# Patient Record
Sex: Female | Born: 1995 | Race: Black or African American | Hispanic: No | Marital: Single | Smoking: Never smoker
Health system: Southern US, Community
[De-identification: ages and names within clinical notes are randomized; demographics above are authoritative.]

## PROBLEM LIST (undated history)

## (undated) ENCOUNTER — Ambulatory Visit: Payer: Medicaid Other | Source: Home / Self Care

## (undated) DIAGNOSIS — N926 Irregular menstruation, unspecified: Secondary | ICD-10-CM

## (undated) DIAGNOSIS — E559 Vitamin D deficiency, unspecified: Secondary | ICD-10-CM

## (undated) DIAGNOSIS — J302 Other seasonal allergic rhinitis: Secondary | ICD-10-CM

## (undated) DIAGNOSIS — J45909 Unspecified asthma, uncomplicated: Secondary | ICD-10-CM

## (undated) HISTORY — PX: ANAL FISSURE REPAIR: SHX2312

---
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
## 2016-01-12 ENCOUNTER — Encounter (HOSPITAL_BASED_OUTPATIENT_CLINIC_OR_DEPARTMENT_OTHER): Payer: Self-pay | Admitting: *Deleted

## 2016-01-12 ENCOUNTER — Emergency Department (HOSPITAL_BASED_OUTPATIENT_CLINIC_OR_DEPARTMENT_OTHER)
Admission: EM | Admit: 2016-01-12 | Discharge: 2016-01-12 | Disposition: A | Discharge status: Home / Self Care | Payer: Medicaid Other | Attending: Emergency Medicine | Admitting: Emergency Medicine

## 2016-01-12 DIAGNOSIS — T7840XA Allergy, unspecified, initial encounter: Secondary | ICD-10-CM | POA: Diagnosis not present

## 2016-01-12 DIAGNOSIS — Z7722 Contact with and (suspected) exposure to environmental tobacco smoke (acute) (chronic): Secondary | ICD-10-CM | POA: Insufficient documentation

## 2016-01-12 DIAGNOSIS — Z9109 Other allergy status, other than to drugs and biological substances: Secondary | ICD-10-CM

## 2016-01-12 DIAGNOSIS — R21 Rash and other nonspecific skin eruption: Secondary | ICD-10-CM | POA: Diagnosis present

## 2016-01-12 MED ORDER — LORATADINE 10 MG PO TABS: 10.0000 mg | ORAL_TABLET | Freq: Every day | ORAL | Status: DC

## 2016-01-12 NOTE — Discharge Instructions (Signed)

## 2016-01-12 NOTE — ED Notes (Signed)
Pt states she needs a note to return to work.

## 2016-01-12 NOTE — ED Provider Notes (Signed)
CSN: [id number]     Arrival date & time 01/12/16  1912 History   First MD Initiated Contact with Patient 01/12/16 2031     Chief Complaint  Patient presents with  . Rash    HPI   20 year old female presents today with complaints of rash. Patient reports that several days ago she developed a rash around her neck, redness, itching. She reports that she recently changed laundry detergent, and has changed back. She reports symptoms have resolved, and denies any complaints presently. Patient notes that she tried returning to work, and they required a work note for her to return. Asian has no signs or symptoms today. Patient also reports she has a history of allergies and is requesting refill of her allergy medication.  Past Medical History  Diagnosis Date  . Seasonal allergies    History reviewed. No pertinent past surgical history. No family history on file. Social History  Substance Use Topics  . Smoking status: Passive Smoke Exposure - Never Smoker  . Smokeless tobacco: None  . Alcohol Use: No   OB History    No data available     Review of Systems  All other systems reviewed and are negative.   Allergies  Review of patient's allergies indicates no known allergies.  Home Medications   Prior to Admission medications   Medication Sig Start Date End Date Taking? Authorizing Provider  cetirizine (ZYRTEC) 10 MG tablet Take 1 tablet (10 mg total) by mouth daily. 11/22/15   Jerelyn Scott, MD  cetirizine-pseudoephedrine (ZYRTEC-D) 5-120 MG per tablet Take 1 tablet by mouth daily. 11/27/14   Teressa Lower, NP  loratadine (CLARITIN) 10 MG tablet Take 1 tablet (10 mg total) by mouth daily. 01/12/16   Eyvonne Mechanic, PA-C  predniSONE (DELTASONE) 20 MG tablet Take 2 tablets (40 mg total) by mouth daily. 12/04/15   Renne Crigler, PA-C   BP 126/88 mmHg  Pulse 84  Temp(Src) 98.3 F (36.8 C) (Oral)  Resp 20  Ht 5' 3.5" (1.613 m)  Wt 81.647 kg  BMI 31.38 kg/m2  SpO2 100%  LMP 12/27/2015    Physical Exam  Constitutional: She is oriented to person, place, and time. She appears well-developed and well-nourished.  HENT:  Head: Normocephalic and atraumatic.  Eyes: Conjunctivae are normal. Pupils are equal, round, and reactive to light. Right eye exhibits no discharge. Left eye exhibits no discharge. No scleral icterus.  Neck: Normal range of motion. No JVD present. No tracheal deviation present.  Pulmonary/Chest: Effort normal. No stridor.  Neurological: She is alert and oriented to person, place, and time. Coordination normal.  Skin: Skin is warm and dry. No rash noted. No erythema. No pallor.  Psychiatric: She has a normal mood and affect. Her behavior is normal. Judgment and thought content normal.  Nursing note and vitals reviewed.   ED Course  Procedures (including critical care time) Labs Review Labs Reviewed - No data to display  Imaging Review No results found. I have personally reviewed and evaluated these images and lab results as part of my medical decision-making.   EKG Interpretation None      MDM   Final diagnoses:  Environmental allergies    Labs:  Imaging:  Consults:  Therapeutics:  Discharge Meds:   Assessment/Plan: 24 oh female presents today for a work note so that she may return to work. She has no signs of rash today, no reason why she may not return to work. Patient given refill of her allergy medication.  Eyvonne MechanicJeffrey Hailley Byers, PA-C 01/12/16 2117  Doug SouSam Jacubowitz, MD 01/12/16 [phone]

## 2016-01-12 NOTE — ED Notes (Signed)
She had a rash on her neck and chest 2 days ago. No rash today. She needs a work note in order to go back to work.

## 2016-03-26 ENCOUNTER — Emergency Department (HOSPITAL_COMMUNITY): Payer: Medicaid Other

## 2016-03-26 ENCOUNTER — Encounter (HOSPITAL_COMMUNITY): Payer: Self-pay

## 2016-03-26 DIAGNOSIS — M25511 Pain in right shoulder: Secondary | ICD-10-CM | POA: Diagnosis not present

## 2016-03-26 DIAGNOSIS — R0789 Other chest pain: Secondary | ICD-10-CM | POA: Insufficient documentation

## 2016-03-26 DIAGNOSIS — Z7722 Contact with and (suspected) exposure to environmental tobacco smoke (acute) (chronic): Secondary | ICD-10-CM | POA: Diagnosis not present

## 2016-03-26 LAB — CBC
HCT: 35 % — ABNORMAL LOW (ref 36.0–46.0)
Hemoglobin: 12 g/dL (ref 12.0–15.0)
MCH: 29.3 pg (ref 26.0–34.0)
MCHC: 34.3 g/dL (ref 30.0–36.0)
MCV: 85.4 fL (ref 78.0–100.0)
PLATELETS: 312 10*3/uL (ref 150–400)
RBC: 4.1 MIL/uL (ref 3.87–5.11)
RDW: 13 % (ref 11.5–15.5)
WBC: 6.8 10*3/uL (ref 4.0–10.5)

## 2016-03-26 LAB — BASIC METABOLIC PANEL
Anion gap: 5 (ref 5–15)
BUN: 14 mg/dL (ref 6–20)
CALCIUM: 9 mg/dL (ref 8.9–10.3)
CHLORIDE: 106 mmol/L (ref 101–111)
CO2: 27 mmol/L (ref 22–32)
CREATININE: 0.79 mg/dL (ref 0.44–1.00)
GFR calc non Af Amer: >60 mL/min (ref >60)
GLUCOSE: 95 mg/dL (ref 65–99)
Potassium: 3.7 mmol/L (ref 3.5–5.1)
Sodium: 138 mmol/L (ref 135–145)

## 2016-03-26 LAB — I-STAT TROPONIN, ED: TROPONIN I, POC: 0 ng/mL (ref 0.00–0.08)

## 2016-03-26 NOTE — ED Triage Notes (Signed)
Patient c/o right sided chest pain that began today.  Pain is also felt in her back and shoulder.  Patient states that pain became worse today. Patient states that has not taken anything for the pain.  Denies SOB, denies N/V.  Rates pain 8/10.

## 2016-03-27 ENCOUNTER — Emergency Department (HOSPITAL_COMMUNITY)
Admission: EM | Admit: 2016-03-27 | Discharge: 2016-03-27 | Disposition: A | Discharge status: Home / Self Care | Payer: Medicaid Other | Attending: Emergency Medicine | Admitting: Emergency Medicine

## 2016-03-27 DIAGNOSIS — R0789 Other chest pain: Secondary | ICD-10-CM

## 2016-03-27 MED ORDER — IBUPROFEN 800 MG PO TABS
800.0000 mg | ORAL_TABLET | Freq: Three times a day (TID) | ORAL | 0 refills | Status: DC
Start: 2016-03-27 — End: 2016-07-18

## 2016-03-27 MED ORDER — KETOROLAC TROMETHAMINE 60 MG/2ML IM SOLN
60.0000 mg | Freq: Once | INTRAMUSCULAR | Status: DC
  Filled 2016-03-27: qty 2

## 2016-03-27 MED ORDER — IBUPROFEN 800 MG PO TABS
800.0000 mg | ORAL_TABLET | Freq: Once | ORAL | Status: AC
  Administered 2016-03-27: 800 mg via ORAL
  Filled 2016-03-27: qty 1

## 2016-03-27 NOTE — ED Notes (Signed)
Pt refused blood draw and toradol informed provider.

## 2016-03-27 NOTE — ED Provider Notes (Signed)
[facility]-EMERGENCY DEPT Provider Note   CSN: [id number] Arrival date & time: 03/26/16  2201  By signing my name below, I, Modena Jansky, attest that this documentation has been prepared under the direction and in the presence of non-physician practitioner, Cheri Fowler, PA-C. Electronically Signed: Modena Jansky, Scribe. 03/27/2016. 12:49 AM.  History   Chief Complaint Chief Complaint  Patient presents with  . Chest Pain   The history is provided by the patient. No language interpreter was used.    HPI Comments: Annette Ferguson is a 20 y.o. female who presents to the Emergency Department complaining of gradual onset,  constant moderate right sided chest pain that started this morning. She states that her gradual onset pain worsened and started radiating to her right shoulder and back throughout the day. She describes the chest pain as a pressure sensation, and her shoulder pain as a sharp sensation, with both being exacerbated by movement and improved by sitting still. She rates severity of her pain as a 9/10 when moving. She reports no injury or medication PTA. She states her grandmother died of MI at age 29. She denies any use of contraceptives, recent long trips or surgery, unilateral lower extremity edema, hx of DVT/PE, or personal cardiac history.  She also denies any SOB, cough, fever, numbness, weakness, abdominal pain, nausea, or vomiting.    LMNP: last month   PCP: Ferman Hamming, MD Past Medical History:  Diagnosis Date  . Seasonal allergies     There are no active problems to display for this patient.   History reviewed. No pertinent surgical history.  OB History    No data available       Home Medications    Prior to Admission medications   Medication Sig Start Date End Date Taking? Authorizing Provider  cetirizine (ZYRTEC) 10 MG tablet Take 1 tablet (10 mg total) by mouth daily. Patient not taking: Reported on 03/27/2016 11/22/15   Jerelyn Scott, MD    cetirizine-pseudoephedrine (ZYRTEC-D) 5-120 MG per tablet Take 1 tablet by mouth daily. Patient not taking: Reported on 03/27/2016 11/27/14   Teressa Lower, NP  ibuprofen (ADVIL,MOTRIN) 800 MG tablet Take 1 tablet (800 mg total) by mouth 3 (three) times daily. 03/27/16   Cheri Fowler, PA-C  loratadine (CLARITIN) 10 MG tablet Take 1 tablet (10 mg total) by mouth daily. Patient not taking: Reported on 03/27/2016 01/12/16   Eyvonne Mechanic, PA-C  predniSONE (DELTASONE) 20 MG tablet Take 2 tablets (40 mg total) by mouth daily. Patient not taking: Reported on 03/27/2016 12/04/15   Renne Crigler, PA-C    Family History No family history on file.  Social History Social History  Substance Use Topics  . Smoking status: Passive Smoke Exposure - Never Smoker  . Smokeless tobacco: Never Used  . Alcohol use No     Allergies   Review of patient's allergies indicates no known allergies.   Review of Systems Review of Systems  Constitutional: Negative for fever.  Respiratory: Negative for cough and shortness of breath.   Cardiovascular: Positive for chest pain (right sided).  Gastrointestinal: Negative for abdominal pain, nausea and vomiting.  Musculoskeletal: Positive for myalgias (right shoulder).  Neurological: Negative for weakness and numbness.     Physical Exam Updated Vital Signs BP 118/69 (BP Location: Left Arm)   Pulse 74   Temp 98.9 F (37.2 C) (Oral)   Resp 17   Ht 5\' 3"  (1.6 m)   Wt 190 lb (86.2 kg)   LMP 02/05/2016  SpO2 98%   BMI 33.66 kg/m   Physical Exam  Constitutional: She is oriented to person, place, and time. She appears well-developed and well-nourished.  Non-toxic appearance. She does not have a sickly appearance. She does not appear ill.  HENT:  Head: Normocephalic and atraumatic.  Mouth/Throat: Oropharynx is clear and moist.  Eyes: Conjunctivae are normal.  Neck: Normal range of motion. Neck supple.  Cardiovascular: Normal rate and regular rhythm.    Pulmonary/Chest: Effort normal and breath sounds normal. No accessory muscle usage or stridor. No respiratory distress. She has no wheezes. She has no rhonchi. She has no rales. She exhibits tenderness.    Abdominal: Soft. Bowel sounds are normal. She exhibits no distension. There is no tenderness.  Musculoskeletal: Normal range of motion.  Lymphadenopathy:    She has no cervical adenopathy.  Neurological: She is alert and oriented to person, place, and time.  Speech clear without dysarthria.  Skin: Skin is warm and dry.  Psychiatric: She has a normal mood and affect. Her behavior is normal.     ED Treatments / Results  DIAGNOSTIC STUDIES: Oxygen Saturation is 98% on RA, normal by my interpretation.    COORDINATION OF CARE: 12:55 AM- Pt advised of plan for treatment, which includes radiology and labs, and pt agrees.  Labs (all labs ordered are listed, but only abnormal results are displayed) Labs Reviewed  CBC - Abnormal; Notable for the following:       Result Value   HCT 35.0 (*)    All other components within normal limits  BASIC METABOLIC PANEL  I-STAT TROPOININ, ED    EKG  EKG Interpretation  Date/Time:  Saturday March 26 2016 22:17:27 EDT Ventricular Rate:  75 PR Interval:    QRS Duration: 92 QT Interval:  394 QTC Calculation: 441 R Axis:   76 Text Interpretation:  Sinus rhythm Borderline T abnormalities, anterior leads No significant change since last tracing Confirmed by KNOTT MD, DANIEL [phone]) on 03/27/2016 1:28:42 AM       Radiology Dg Chest 2 View  Result Date: 03/26/2016 CLINICAL DATA:  Acute onset of right-sided chest pain. Initial encounter. EXAM: CHEST  2 VIEW COMPARISON:  Chest radiograph performed 12/04/2015 FINDINGS: The lungs are well-aerated and clear. There is no evidence of focal opacification, pleural effusion or pneumothorax. The heart is normal in size; the mediastinal contour is within normal limits. No acute osseous abnormalities are  seen. IMPRESSION: No acute cardiopulmonary process seen. Electronically Signed   By: Roanna RaiderJeffery  Chang M.D.   On: 03/26/2016 22:58    Procedures Procedures (including critical care time)  Medications Ordered in ED Medications  ketorolac (TORADOL) injection 60 mg (60 mg Intramuscular Refused 03/27/16 0200)  ibuprofen (ADVIL,MOTRIN) tablet 800 mg (800 mg Oral Given 03/27/16 0206)     Initial Impression / Assessment and Plan / ED Course  I have reviewed the triage vital signs and the nursing notes.  Pertinent labs & imaging results that were available during my care of the patient were reviewed by me and considered in my medical decision making (see chart for details).  Clinical Course  Patient presents with atypical chest pain.  Worse with movement.  Pain is reproducible to palpation.  No SOB.  VSS, NAD. Heart RRR, lungs CTAB.  Improved with ibuprofen.  Low risk Well's criteria for PE, PERC negative. Doubt ACS, EKG unchanged from previous.  CXR normal. Doubt dissection.  Labs without acute abnormalities.  Suspect musculoskeletal etiology.  Home with ibuprofen.  Follow up  PCP.  Return precautions discussed.  Stable for discharge.    Final Clinical Impressions(s) / ED Diagnoses   Final diagnoses:  Atypical chest pain    New Prescriptions New Prescriptions   IBUPROFEN (ADVIL,MOTRIN) 800 MG TABLET    Take 1 tablet (800 mg total) by mouth 3 (three) times daily.   I personally performed the services described in this documentation, which was scribed in my presence. The recorded information has been reviewed and is accurate.     Cheri FowlerKayla Tell Rozelle, PA-C 03/27/16 [id number]    Lyndal Pulleyaniel Knott, MD 03/28/16 0230

## 2016-03-27 NOTE — ED Notes (Signed)
Patient called x1, no answer

## 2016-03-27 NOTE — ED Notes (Signed)
No respiratory or acute distress noted alert and oriented x 3 steady gait noted no reaction to medication noted.

## 2016-05-07 ENCOUNTER — Encounter (HOSPITAL_BASED_OUTPATIENT_CLINIC_OR_DEPARTMENT_OTHER): Payer: Self-pay | Admitting: Emergency Medicine

## 2016-05-07 ENCOUNTER — Emergency Department (HOSPITAL_BASED_OUTPATIENT_CLINIC_OR_DEPARTMENT_OTHER)
Admission: EM | Admit: 2016-05-07 | Discharge: 2016-05-07 | Disposition: A | Discharge status: Home / Self Care | Payer: Medicaid Other | Attending: Emergency Medicine | Admitting: Emergency Medicine

## 2016-05-07 DIAGNOSIS — X501XXA Overexertion from prolonged static or awkward postures, initial encounter: Secondary | ICD-10-CM | POA: Insufficient documentation

## 2016-05-07 DIAGNOSIS — Y929 Unspecified place or not applicable: Secondary | ICD-10-CM | POA: Insufficient documentation

## 2016-05-07 DIAGNOSIS — Y9301 Activity, walking, marching and hiking: Secondary | ICD-10-CM | POA: Diagnosis not present

## 2016-05-07 DIAGNOSIS — M79605 Pain in left leg: Secondary | ICD-10-CM | POA: Diagnosis present

## 2016-05-07 DIAGNOSIS — Z7722 Contact with and (suspected) exposure to environmental tobacco smoke (acute) (chronic): Secondary | ICD-10-CM | POA: Insufficient documentation

## 2016-05-07 DIAGNOSIS — Y999 Unspecified external cause status: Secondary | ICD-10-CM | POA: Diagnosis not present

## 2016-05-07 NOTE — ED Provider Notes (Signed)
[facility]   CSN: [id number] Arrival date & time: 05/07/16  1355  By signing my name below, I, Nelwyn SalisburyJoshua Fowler, attest that this documentation has been prepared under the direction and in the presence of Laurence Spatesachel Morgan Havilah Topor, MD . Electronically Signed: Nelwyn SalisburyJoshua Fowler, Scribe. 05/07/2016. 3:23 PM.  History   Chief Complaint Chief Complaint  Patient presents with  . Leg Pain   The history is provided by the patient. No language interpreter was used.    HPI Comments:  Annette Ferguson is a 20 y.o. female with no pertinent Pmhx who presents to the Emergency Department complaining of unchanged left leg pain beginning two days ago. She describes her symptoms as a sharp pain that begins in her left ankle and radiates up her left leg, exacerbated by standing. Sx began as b/l leg pain after working a double shift at work 2 days ago; she states her body is not used to doing this and she suspects she strained her legs from prolonged standing and walking. Her pain has been slightly alleviated by resting the area. Pt has not tried any OTC medications to relieve pain. When asked about any numbness, pt states that her leg was numb 2 nights ago but her symptoms are currently resolved. She denies any recent falls, injury, leg swelling, erythema, or tingling.  Pt has not traveled recently, has no history of blood clots, and is not on birth control.   Past Medical History:  Diagnosis Date  . Seasonal allergies     There are no active problems to display for this patient.   History reviewed. No pertinent surgical history.  OB History    No data available       Home Medications    Prior to Admission medications   Medication Sig Start Date End Date Taking? Authorizing Provider  cetirizine (ZYRTEC) 10 MG tablet Take 1 tablet (10 mg total) by mouth daily. Patient not taking: Reported on 03/27/2016 11/22/15   Jerelyn ScottMartha Linker, MD  cetirizine-pseudoephedrine (ZYRTEC-D) 5-120 MG  per tablet Take 1 tablet by mouth daily. Patient not taking: Reported on 03/27/2016 11/27/14   Teressa LowerVrinda Pickering, NP  ibuprofen (ADVIL,MOTRIN) 800 MG tablet Take 1 tablet (800 mg total) by mouth 3 (three) times daily. 03/27/16   Cheri FowlerKayla Rose, PA-C  loratadine (CLARITIN) 10 MG tablet Take 1 tablet (10 mg total) by mouth daily. Patient not taking: Reported on 03/27/2016 01/12/16   Eyvonne MechanicJeffrey Hedges, PA-C  predniSONE (DELTASONE) 20 MG tablet Take 2 tablets (40 mg total) by mouth daily. Patient not taking: Reported on 03/27/2016 12/04/15   Renne CriglerJoshua Geiple, PA-C    Family History No family history on file.  Social History Social History  Substance Use Topics  . Smoking status: Passive Smoke Exposure - Never Smoker  . Smokeless tobacco: Never Used  . Alcohol use No     Allergies   Review of patient's allergies indicates no known allergies.   Review of Systems Review of Systems 10 systems reviewed and all are negative for acute change except as noted in the HPI.   Physical Exam Updated Vital Signs BP 120/74 (BP Location: Right Arm)   Pulse 71   Temp 98.4 F (36.9 C) (Oral)   Resp 14   Ht 5\' 3"  (1.6 m)   Wt 180 lb (81.6 kg)   LMP 04/05/2016 (Approximate)   SpO2 100%   BMI 31.89 kg/m   Physical Exam  Constitutional: She is oriented to person, place, and time. She appears  well-developed and well-nourished. No distress.  HENT:  Head: Normocephalic and atraumatic.  Moist mucous membranes  Eyes: Conjunctivae are normal. Pupils are equal, round, and reactive to light.  Neck: Neck supple.  Cardiovascular: Normal rate, regular rhythm and intact distal pulses.   Murmur heard.  Systolic murmur is present with a grade of 1/6  Pulmonary/Chest: Effort normal and breath sounds normal.  Abdominal: Soft. Bowel sounds are normal. She exhibits no distension. There is no tenderness.  Musculoskeletal: Normal range of motion. She exhibits no edema, tenderness or deformity.  Neurological: She is alert and  oriented to person, place, and time.  Fluent speech  Skin: Skin is warm and dry. Capillary refill takes less than 2 seconds. No rash noted. No erythema.  Psychiatric: She has a normal mood and affect. Judgment normal.  Nursing note and vitals reviewed.  ED Treatments / Results  DIAGNOSTIC STUDIES:  Oxygen Saturation is 100% on RA, normal by my interpretation.    COORDINATION OF CARE:  3:34 PM Discussed treatment plan with pt at bedside which included rest, elevation and compression stockings and pt agreed to plan.  Labs (all labs ordered are listed, but only abnormal results are displayed) Labs Reviewed - No data to display  EKG  EKG Interpretation None       Radiology No results found.  Procedures Procedures (including critical care time)  Medications Ordered in ED Medications - No data to display   Initial Impression / Assessment and Plan / ED Course  I have reviewed the triage vital signs and the nursing notes.  Clinical Course   2 Days of atraumatic left leg pain that began bilaterally and has persisted in left leg. Recent increased activity at work. No evidence of trauma, swelling, or focal tenderness on exam. Neurovascularly intact. No risk factors for blood clots. No skin changes to suggest infectious process. Given that her symptoms have RD improved with rest, I instructed her to continue resting and discussed supportive care for muscle strain. Encouraged to use compression stockings at work. Patient voiced understanding and was discharged in satisfactory condition.  Final Clinical Impressions(s) / ED Diagnoses   Final diagnoses:  Left leg pain    New Prescriptions Discharge Medication List as of 05/07/2016  3:38 PM    I personally performed the services described in this documentation, which was scribed in my presence. The recorded information has been reviewed and is accurate.    Laurence Spates, MD 05/07/16 [phone]

## 2016-05-07 NOTE — Discharge Instructions (Signed)
Keep your legs elevated when you are sitting and wear compression stockings at work.

## 2016-05-07 NOTE — ED Triage Notes (Signed)
Pt reports left leg pain after working an extra shift at work yesterday, being on her feet "all day for 2 days".  Pain starts in ankle region and extends up through left thigh.

## 2016-07-18 ENCOUNTER — Emergency Department (HOSPITAL_COMMUNITY): Payer: Medicaid Other

## 2016-07-18 ENCOUNTER — Encounter (HOSPITAL_COMMUNITY): Payer: Self-pay | Admitting: Emergency Medicine

## 2016-07-18 ENCOUNTER — Emergency Department (HOSPITAL_COMMUNITY)
Admission: EM | Admit: 2016-07-18 | Discharge: 2016-07-19 | Disposition: A | Discharge status: Home / Self Care | Payer: Medicaid Other | Attending: Emergency Medicine | Admitting: Emergency Medicine

## 2016-07-18 DIAGNOSIS — S3992XA Unspecified injury of lower back, initial encounter: Secondary | ICD-10-CM | POA: Diagnosis present

## 2016-07-18 DIAGNOSIS — X58XXXA Exposure to other specified factors, initial encounter: Secondary | ICD-10-CM | POA: Insufficient documentation

## 2016-07-18 DIAGNOSIS — Z79899 Other long term (current) drug therapy: Secondary | ICD-10-CM | POA: Insufficient documentation

## 2016-07-18 DIAGNOSIS — S39012A Strain of muscle, fascia and tendon of lower back, initial encounter: Secondary | ICD-10-CM | POA: Diagnosis not present

## 2016-07-18 DIAGNOSIS — R072 Precordial pain: Secondary | ICD-10-CM | POA: Insufficient documentation

## 2016-07-18 DIAGNOSIS — Z7722 Contact with and (suspected) exposure to environmental tobacco smoke (acute) (chronic): Secondary | ICD-10-CM | POA: Diagnosis not present

## 2016-07-18 DIAGNOSIS — Y939 Activity, unspecified: Secondary | ICD-10-CM | POA: Diagnosis not present

## 2016-07-18 DIAGNOSIS — Y929 Unspecified place or not applicable: Secondary | ICD-10-CM | POA: Insufficient documentation

## 2016-07-18 DIAGNOSIS — Y999 Unspecified external cause status: Secondary | ICD-10-CM | POA: Diagnosis not present

## 2016-07-18 LAB — URINALYSIS, ROUTINE W REFLEX MICROSCOPIC
Bacteria, UA: NONE SEEN
Bilirubin Urine: NEGATIVE
GLUCOSE, UA: NEGATIVE mg/dL
HGB URINE DIPSTICK: NEGATIVE
KETONES UR: NEGATIVE mg/dL
NITRITE: NEGATIVE
PROTEIN: 30 mg/dL — AB
Specific Gravity, Urine: 1.032 — ABNORMAL HIGH (ref 1.005–1.030)
pH: 5 (ref 5.0–8.0)

## 2016-07-18 LAB — BASIC METABOLIC PANEL
Anion gap: 7 (ref 5–15)
BUN: 9 mg/dL (ref 6–20)
CHLORIDE: 102 mmol/L (ref 101–111)
CO2: 26 mmol/L (ref 22–32)
Calcium: 9 mg/dL (ref 8.9–10.3)
Creatinine, Ser: 0.78 mg/dL (ref 0.44–1.00)
GFR calc non Af Amer: >60 mL/min (ref >60)
Glucose, Bld: 86 mg/dL (ref 65–99)
POTASSIUM: 3.6 mmol/L (ref 3.5–5.1)
SODIUM: 135 mmol/L (ref 135–145)

## 2016-07-18 LAB — CBC
HEMATOCRIT: 38.4 % (ref 36.0–46.0)
Hemoglobin: 13.5 g/dL (ref 12.0–15.0)
MCH: 29.7 pg (ref 26.0–34.0)
MCHC: 35.2 g/dL (ref 30.0–36.0)
MCV: 84.4 fL (ref 78.0–100.0)
Platelets: 325 10*3/uL (ref 150–400)
RBC: 4.55 MIL/uL (ref 3.87–5.11)
RDW: 12.6 % (ref 11.5–15.5)
WBC: 5.3 10*3/uL (ref 4.0–10.5)

## 2016-07-18 LAB — I-STAT TROPONIN, ED: Troponin i, poc: 0 ng/mL (ref 0.00–0.08)

## 2016-07-18 MED ORDER — IBUPROFEN 800 MG PO TABS: 800.0000 mg | ORAL_TABLET | Freq: Three times a day (TID) | ORAL | 0 refills | Status: DC | PRN

## 2016-07-18 MED ORDER — ONDANSETRON 4 MG PO TBDP
ORAL_TABLET | ORAL | 0 refills | Status: DC
Start: 2016-07-18 — End: 2017-02-07

## 2016-07-18 MED ORDER — ONDANSETRON 4 MG PO TBDP
4.0000 mg | ORAL_TABLET | Freq: Once | ORAL | Status: AC
  Administered 2016-07-19: 4 mg via ORAL
  Filled 2016-07-18: qty 1

## 2016-07-18 MED ORDER — IBUPROFEN 800 MG PO TABS
800.0000 mg | ORAL_TABLET | Freq: Once | ORAL | Status: AC
  Administered 2016-07-19: 800 mg via ORAL
  Filled 2016-07-18: qty 1

## 2016-07-18 NOTE — ED Provider Notes (Signed)
[facility]-EMERGENCY DEPT Provider Note   CSN: [id number] Arrival date & time: 07/18/16  1727     History   Chief Complaint Chief Complaint  Patient presents with  . Chest Pain  . Back Pain    HPI Annette Ferguson is a 20 y.o. female.  Patient complains of low back pain. Patient also has mild chest discomfort   The history is provided by the patient. No language interpreter was used.  Chest Pain   This is a new problem. The problem occurs rarely. The problem has been resolved. The pain is associated with movement. The pain is present in the substernal region. The pain is at a severity of 2/10. The pain is moderate. The quality of the pain is described as dull. Associated symptoms include back pain. Pertinent negatives include no abdominal pain, no cough and no headaches.  Pertinent negatives for past medical history include no seizures.  Back Pain   Associated symptoms include chest pain. Pertinent negatives include no headaches and no abdominal pain.    Past Medical History:  Diagnosis Date  . Seasonal allergies     There are no active problems to display for this patient.   History reviewed. No pertinent surgical history.  OB History    No data available       Home Medications    Prior to Admission medications   Medication Sig Start Date End Date Taking? Authorizing Provider  Multiple Vitamin (MULTIVITAMIN WITH MINERALS) TABS tablet Take 1 tablet by mouth daily.   Yes Historical Provider, MD  ibuprofen (ADVIL,MOTRIN) 800 MG tablet Take 1 tablet (800 mg total) by mouth every 8 (eight) hours as needed. 07/18/16   Bethann BerkshireJoseph Cala Kruckenberg, MD  ondansetron (ZOFRAN ODT) 4 MG disintegrating tablet 4mg  ODT q4 hours prn nausea/vomit 07/18/16   Bethann BerkshireJoseph Shaila Gilchrest, MD    Family History History reviewed. No pertinent family history.  Social History Social History  Substance Use Topics  . Smoking status: Passive Smoke Exposure - Never Smoker  . Smokeless tobacco: Never Used  .  Alcohol use No     Allergies   Patient has no known allergies.   Review of Systems Review of Systems  Constitutional: Negative for appetite change and fatigue.  HENT: Negative for congestion, ear discharge and sinus pressure.   Eyes: Negative for discharge.  Respiratory: Negative for cough.   Cardiovascular: Positive for chest pain.  Gastrointestinal: Negative for abdominal pain and diarrhea.  Genitourinary: Negative for frequency and hematuria.  Musculoskeletal: Positive for back pain.  Skin: Negative for rash.  Neurological: Negative for seizures and headaches.  Psychiatric/Behavioral: Negative for hallucinations.     Physical Exam Updated Vital Signs BP 117/88   Pulse 83   Temp 98.6 F (37 C) (Oral)   Resp 18   LMP 06/30/2016   SpO2 100%   Physical Exam  Constitutional: She is oriented to person, place, and time. She appears well-developed.  HENT:  Head: Normocephalic.  Eyes: Conjunctivae and EOM are normal. No scleral icterus.  Neck: Neck supple. No thyromegaly present.  Cardiovascular: Normal rate and regular rhythm.  Exam reveals no gallop and no friction rub.   No murmur heard. Pulmonary/Chest: No stridor. She has no wheezes. She has no rales. She exhibits no tenderness.  Abdominal: She exhibits no distension. There is no tenderness. There is no rebound.  Musculoskeletal: Normal range of motion. She exhibits no edema.  Tenderness to lumbar spine  Lymphadenopathy:    She has no cervical adenopathy.  Neurological: She  is oriented to person, place, and time. She exhibits normal muscle tone. Coordination normal.  Skin: No rash noted. No erythema.  Psychiatric: She has a normal mood and affect. Her behavior is normal.     ED Treatments / Results  Labs (all labs ordered are listed, but only abnormal results are displayed) Labs Reviewed  URINALYSIS, ROUTINE W REFLEX MICROSCOPIC - Abnormal; Notable for the following:       Result Value   Color, Urine AMBER  (*)    APPearance HAZY (*)    Specific Gravity, Urine 1.032 (*)    Protein, ur 30 (*)    Leukocytes, UA TRACE (*)    Squamous Epithelial / LPF 0-5 (*)    All other components within normal limits  BASIC METABOLIC PANEL  CBC  I-STAT TROPOININ, ED    EKG  EKG Interpretation  Date/Time:  Monday July 18 2016 17:37:06 EST Ventricular Rate:  96 PR Interval:    QRS Duration: 88 QT Interval:  334 QTC Calculation: 422 R Axis:   83 Text Interpretation:  Sinus rhythm RSR' in V1 or V2, right VCD or RVH Nonspecific T abnormalities, anterior leads Baseline wander in lead(s) III aVF V1 Confirmed by Estiben Mizuno  MD, Farida Mcreynolds [phone]([id number]) on 07/18/2016 9:26:14 PM       Radiology Dg Chest 2 View  Result Date: 07/18/2016 CLINICAL DATA:  Central chest pain with burning feeling like something is stuck in the esophagus. Causes dyspnea times. All EXAM: CHEST  2 VIEW COMPARISON:  03/26/2016 CXR FINDINGS: The heart size and mediastinal contours are within normal limits. Both lungs are clear. No radiopaque foreign body is noted. The visualized skeletal structures are unremarkable. IMPRESSION: No active cardiopulmonary disease. Electronically Signed   By: Tollie Ethavid  Kwon M.D.   On: 07/18/2016 17:56    Procedures Procedures (including critical care time)  Medications Ordered in ED Medications  ondansetron (ZOFRAN-ODT) disintegrating tablet 4 mg (not administered)  ibuprofen (ADVIL,MOTRIN) tablet 800 mg (not administered)     Initial Impression / Assessment and Plan / ED Course  I have reviewed the triage vital signs and the nursing notes.  Pertinent labs & imaging results that were available during my care of the patient were reviewed by me and considered in my medical decision making (see chart for details).  Clinical Course     Labs including CBC chemistries urinalysis unremarkable. Patient will be treated with Motrin for lumbar strain will follow-up as needed  Final Clinical Impressions(s) / ED  Diagnoses   Final diagnoses:  Strain of lumbar region, initial encounter    New Prescriptions New Prescriptions   IBUPROFEN (ADVIL,MOTRIN) 800 MG TABLET    Take 1 tablet (800 mg total) by mouth every 8 (eight) hours as needed.   ONDANSETRON (ZOFRAN ODT) 4 MG DISINTEGRATING TABLET    4mg  ODT q4 hours prn nausea/vomit     Bethann BerkshireJoseph Nicle Connole, MD 07/18/16 2340

## 2016-07-18 NOTE — Discharge Instructions (Signed)
Follow-up with your doctor next week if problems

## 2016-07-18 NOTE — ED Triage Notes (Signed)
Patient reports central chest tightness and lower back pain x4 days. Patient also c/o nausea. Denies V/D, pain radiation, SOB, dizziness. Ambulatory to triage.

## 2016-08-27 ENCOUNTER — Emergency Department (HOSPITAL_BASED_OUTPATIENT_CLINIC_OR_DEPARTMENT_OTHER)
Admission: EM | Admit: 2016-08-27 | Discharge: 2016-08-27 | Discharge status: Against Medical Advice | Payer: Medicaid Other

## 2016-08-27 NOTE — ED Notes (Addendum)
Pt left ED, stating "I can't wait this long."  Pt not given any wait time specifics.  Pt visually inspecting lobby with 30+ people in it.  Pt advised by nurse first to come back at any time with any concerns.  Pt not seen by triage RN.

## 2016-09-09 ENCOUNTER — Emergency Department (HOSPITAL_BASED_OUTPATIENT_CLINIC_OR_DEPARTMENT_OTHER)
Admission: EM | Admit: 2016-09-09 | Discharge: 2016-09-09 | Disposition: A | Discharge status: Home / Self Care | Payer: Medicaid Other | Attending: Emergency Medicine | Admitting: Emergency Medicine

## 2016-09-09 ENCOUNTER — Encounter (HOSPITAL_BASED_OUTPATIENT_CLINIC_OR_DEPARTMENT_OTHER): Payer: Self-pay | Admitting: *Deleted

## 2016-09-09 DIAGNOSIS — M545 Low back pain, unspecified: Secondary | ICD-10-CM

## 2016-09-09 DIAGNOSIS — Z7722 Contact with and (suspected) exposure to environmental tobacco smoke (acute) (chronic): Secondary | ICD-10-CM | POA: Insufficient documentation

## 2016-09-09 MED ORDER — NAPROXEN 250 MG PO TABS
500.0000 mg | ORAL_TABLET | Freq: Once | ORAL | Status: AC
  Administered 2016-09-09: 500 mg via ORAL
  Filled 2016-09-09: qty 2

## 2016-09-09 MED ORDER — NAPROXEN 375 MG PO TABS: ORAL_TABLET | ORAL | 0 refills | Status: DC

## 2016-09-09 NOTE — ED Triage Notes (Addendum)
C/o back and leg pain x 2 weeks. Sob when supine at times, denies ua sx,  Ambulatory without diff

## 2016-09-09 NOTE — ED Provider Notes (Addendum)
[facility] Provider Note: Lowella Dell, MD, FACEP  CSN: [id number] MRN: [id number] ARRIVAL: 09/09/16 at 0234 ROOM: [facility]   CHIEF COMPLAINT  Back Pain   HISTORY OF PRESENT ILLNESS  Annette Ferguson is a 21 y.o. female with several weeks of back pain. She states her back pain is usually in the midline but at work earlier she developed pain more laterally between her iliac crest in her greater trochanter. Pain is somewhat worse with movement. Pain is not particularly changed with palpation. Pain is mild to moderate. She has not taken anything for this pain. There is no associated numbness or weakness. She has also had some aching in her lower legs when standing for a while.   Past Medical History:  Diagnosis Date  . Seasonal allergies     History reviewed. No pertinent surgical history.  No family history on file.  Social History  Substance Use Topics  . Smoking status: Passive Smoke Exposure - Never Smoker  . Smokeless tobacco: Never Used  . Alcohol use No    Prior to Admission medications   Medication Sig Start Date End Date Taking? Authorizing Provider  ibuprofen (ADVIL,MOTRIN) 800 MG tablet Take 1 tablet (800 mg total) by mouth every 8 (eight) hours as needed. 07/18/16   Bethann Berkshire, MD  Multiple Vitamin (MULTIVITAMIN WITH MINERALS) TABS tablet Take 1 tablet by mouth daily.    Historical Provider, MD  ondansetron (ZOFRAN ODT) 4 MG disintegrating tablet 4mg  ODT q4 hours prn nausea/vomit 07/18/16   Bethann Berkshire, MD    Allergies Patient has no known allergies.   REVIEW OF SYSTEMS  Negative except as noted here or in the History of Present Illness.   PHYSICAL EXAMINATION  Initial Vital Signs Blood pressure 120/80, pulse 71, temperature 97.4 F (36.3 C), temperature source Oral, resp. rate 18, height 5\' 3"  (1.6 m), weight 190 lb (86.2 kg), last menstrual period 07/27/2016, SpO2 100 %.  Examination General: Well-developed, well-nourished female  in no acute distress; appearance consistent with age of record HENT: normocephalic; atraumatic Eyes: pupils equal, round and reactive to light; extraocular muscles intact Neck: supple Heart: regular rate and rhythm Lungs: clear to auscultation bilaterally Abdomen: soft; nondistended; nontender; bowel sounds present Back: No lower back tenderness; negative straight leg raise on the left Extremities: No deformity; full range of motion; pulses normal Neurologic: Awake, alert and oriented; motor function intact in all extremities and symmetric; no facial droop; sensation intact and symmetric in lower extremities Skin: Warm and dry Psychiatric: Normal mood and affect   RESULTS  Summary of this visit's results, reviewed by myself:   EKG Interpretation  Date/Time:    Ventricular Rate:    PR Interval:    QRS Duration:   QT Interval:    QTC Calculation:   R Axis:     Text Interpretation:        Laboratory Studies: No results found for this or any previous visit (from the past 24 hour(s)). Imaging Studies: No results found.  ED COURSE  Nursing notes and initial vitals signs, including pulse oximetry, reviewed.  Vitals:   09/09/16 0249 09/09/16 0251  BP:  120/80  Pulse:  71  Resp:  18  Temp:  97.4 F (36.3 C)  TempSrc:  Oral  SpO2:  100%  Weight: 190 lb (86.2 kg)   Height: 5\' 3"  (1.6 m)     PROCEDURES    ED DIAGNOSES     ICD-9-CM ICD-10-CM   1. Acute left-sided low  back pain without sciatica 724.2 M54.5        Paula LibraJohn Shanequia Kendrick, MD 09/09/16 0345    Paula LibraJohn De Libman, MD 09/09/16 [phone]

## 2016-09-27 ENCOUNTER — Encounter (HOSPITAL_BASED_OUTPATIENT_CLINIC_OR_DEPARTMENT_OTHER): Payer: Self-pay | Admitting: *Deleted

## 2016-09-27 DIAGNOSIS — R079 Chest pain, unspecified: Secondary | ICD-10-CM | POA: Insufficient documentation

## 2016-09-27 DIAGNOSIS — R51 Headache: Secondary | ICD-10-CM | POA: Diagnosis not present

## 2016-09-27 DIAGNOSIS — Z791 Long term (current) use of non-steroidal anti-inflammatories (NSAID): Secondary | ICD-10-CM | POA: Insufficient documentation

## 2016-09-27 DIAGNOSIS — R0602 Shortness of breath: Secondary | ICD-10-CM | POA: Diagnosis not present

## 2016-09-27 DIAGNOSIS — Z7722 Contact with and (suspected) exposure to environmental tobacco smoke (acute) (chronic): Secondary | ICD-10-CM | POA: Diagnosis not present

## 2016-09-27 DIAGNOSIS — R0789 Other chest pain: Secondary | ICD-10-CM | POA: Diagnosis present

## 2016-09-27 NOTE — ED Triage Notes (Signed)
Pt reports SHOB when she tries to lie  down x 2 weeks reports HA x 1 week

## 2016-09-28 ENCOUNTER — Emergency Department (HOSPITAL_BASED_OUTPATIENT_CLINIC_OR_DEPARTMENT_OTHER): Payer: Medicaid Other

## 2016-09-28 ENCOUNTER — Ambulatory Visit (HOSPITAL_BASED_OUTPATIENT_CLINIC_OR_DEPARTMENT_OTHER): Payer: Medicaid Other

## 2016-09-28 ENCOUNTER — Emergency Department (HOSPITAL_BASED_OUTPATIENT_CLINIC_OR_DEPARTMENT_OTHER)
Admission: EM | Admit: 2016-09-28 | Discharge: 2016-09-28 | Discharge status: Against Medical Advice | Payer: Medicaid Other | Attending: Emergency Medicine | Admitting: Emergency Medicine

## 2016-09-28 DIAGNOSIS — R519 Headache, unspecified: Secondary | ICD-10-CM

## 2016-09-28 DIAGNOSIS — R079 Chest pain, unspecified: Secondary | ICD-10-CM

## 2016-09-28 DIAGNOSIS — R51 Headache: Secondary | ICD-10-CM

## 2016-09-28 DIAGNOSIS — R0602 Shortness of breath: Secondary | ICD-10-CM

## 2016-09-28 MED ORDER — IBUPROFEN 800 MG PO TABS: 800.0000 mg | ORAL_TABLET | Freq: Once | ORAL | Status: DC

## 2016-09-28 NOTE — ED Notes (Signed)
ED Provider at bedside. 

## 2016-09-28 NOTE — ED Notes (Signed)
Pt found walking down the hallway.  Pt stated that she is leaving and is going to go to another doctor because "You take too long."  Radiology tech is present to take pt for CXR but pt is refusing to stay. Sts we take too long and "I'm about to go to another doctor."

## 2016-09-28 NOTE — ED Provider Notes (Signed)
TIME SEEN: 2:10 AM  CHIEF COMPLAINT: Multiple complaints  HPI: Patient is a 21 year old female who presents emergency department with 2 separate complaints. Patient reports that for the past 2 weeks she has had shortness of breath when she has been sleeping. She denies having any cough but states she has had some chest tightness. No fever. Chest pain is not pleuritic or exertional. No history of ACS.  No history of PE, DVT, exogenous estrogen use, recent fractures, surgery, trauma, hospitalization or prolonged travel. No lower extremity swelling or pain. No calf tenderness.   Patient also complains of frontal sharp headache. Has had a history of similar headaches. Did not take any medications prior to arrival. Denies head injury. No numbness, tingling or focal weakness.   States she did see her pediatrician at [facility] on Friday 09/23/16 for a physical but states that she did not mention any of the symptoms to her pediatrician.  ROS: See HPI Constitutional: no fever  Eyes: no drainage  ENT: no runny nose   Cardiovascular:   chest pain  Resp:  SOB  GI: no vomiting GU: no dysuria Integumentary: no rash  Allergy: no hives  Musculoskeletal: no leg swelling  Neurological: no slurred speech ROS otherwise negative  PAST MEDICAL HISTORY/PAST SURGICAL HISTORY:  Past Medical History:  Diagnosis Date  . Seasonal allergies     MEDICATIONS:  Prior to Admission medications   Medication Sig Start Date End Date Taking? Authorizing Provider  naproxen (NAPROSYN) 375 MG tablet Take one tablet twice daily as needed for back pain. 09/09/16   Paula Libra, MD  ondansetron (ZOFRAN ODT) 4 MG disintegrating tablet 4mg  ODT q4 hours prn nausea/vomit 07/18/16   Bethann Berkshire, MD    ALLERGIES:  No Known Allergies  SOCIAL HISTORY:  Social History  Substance Use Topics  . Smoking status: Passive Smoke Exposure - Never Smoker  . Smokeless tobacco: Never Used  . Alcohol use No    FAMILY  HISTORY: History reviewed. No pertinent family history.  EXAM: BP 114/73 (BP Location: Left Arm)   Pulse 73   Temp 98.1 F (36.7 C) (Oral)   Resp 16   Ht 5\' 3"  (1.6 m)   Wt 190 lb (86.2 kg)   LMP 09/26/2016 (Exact Date)   SpO2 100%   BMI 33.66 kg/m  CONSTITUTIONAL: Alert and oriented and responds appropriately to questions. Well-appearing; well-nourished, Patient is difficult to obtain a history from. She is uncooperative. Falling asleep repeatedly during questioning but is arousable. HEAD: Normocephalic, atraumatic EYES: Conjunctivae clear, PERRL, EOMI ENT: normal nose; no rhinorrhea; moist mucous membranes NECK: Supple, no meningismus, no nuchal rigidity, no LAD  CARD: RRR; S1 and S2 appreciated; no murmurs, no clicks, no rubs, no gallops RESP: Normal chest excursion without splinting or tachypnea; breath sounds clear and equal bilaterally; no wheezes, no rhonchi, no rales, no hypoxia or respiratory distress, speaking full sentences ABD/GI: Normal bowel sounds; non-distended; soft, non-tender, no rebound, no guarding, no peritoneal signs, no hepatosplenomegaly BACK:  The back appears normal and is non-tender to palpation, there is no CVA tenderness EXT: Normal ROM in all joints; non-tender to palpation; no edema; normal capillary refill; no cyanosis, no calf tenderness or swelling    SKIN: Normal color for age and race; warm; no rash NEURO: Moves all extremities equally, sensation to light touch intact diffusely, cranial nerves II through XII intact, normal speech, normal gait PSYCH: The patient's mood and manner are appropriate. Grooming and personal hygiene are appropriate.  MEDICAL DECISION  MAKING: Patient here with complaints of atypical chest pain and shortness of breath. Doubt PE or dissection but will obtain chest x-ray and EKG.  Lungs are clear to auscultation here. She is hemodynamically stable. When I into the room patient is asleep and does not appear to be in any  respiratory distress and is not hypoxic.   Patient also complaining of headache. Suspect possible migraine. No aggravating or relieving factors. No neurologic deficits. Recommended treatment with migraine cocktail.   Have requested that patient provide us with a urine pregnancy test. When she is asked to provide this urine sample patient seems to become upset. Patient states that she is not sexually active. States she is a virgin. We will cancel this test.  ED PROGRESS: Patient seen walking out of the emergency department by nursing staff. She was not given information about leaving AGAINST MEDICAL ADVICE. Did not receive any further workup here.     Layla MawKristen N Chelsey Redondo, DO 09/28/16 [phone]

## 2016-12-23 ENCOUNTER — Ambulatory Visit (HOSPITAL_COMMUNITY)
Admission: EM | Admit: 2016-12-23 | Discharge: 2016-12-23 | Disposition: A | Discharge status: Home / Self Care | Payer: Medicaid Other | Attending: Internal Medicine | Admitting: Internal Medicine

## 2016-12-23 ENCOUNTER — Encounter (HOSPITAL_COMMUNITY): Payer: Self-pay

## 2016-12-23 DIAGNOSIS — J301 Allergic rhinitis due to pollen: Secondary | ICD-10-CM

## 2016-12-23 DIAGNOSIS — R0982 Postnasal drip: Secondary | ICD-10-CM | POA: Diagnosis not present

## 2016-12-23 DIAGNOSIS — H1013 Acute atopic conjunctivitis, bilateral: Secondary | ICD-10-CM | POA: Diagnosis not present

## 2016-12-23 MED ORDER — PREDNISONE 50 MG PO TABS: ORAL_TABLET | ORAL | 0 refills | Status: DC

## 2016-12-23 NOTE — ED Triage Notes (Signed)
Pt having bilateral ear pain, headaches, sore throat and eyes are burning for 2 weeks. No fever and said she has tried benadryl and zyrtec without relief.

## 2016-12-23 NOTE — Discharge Instructions (Signed)
Sudafed PE 10 mg every 4 to 6 hours as needed for congestion Allegra or Zyrtec daily as needed for drainage and runny nose. For stronger antihistamine may take Chlor-Trimeton 2 to 4 mg every 4 to 6 hours, may cause drowsiness. Saline nasal spray used frequently. While you are taking the prednisone do not take ibuprofen take Tylenol every 4 hours as needed. Drink plenty of fluids and stay well-hydrated. Flonase or Rhinocort nasal spray daily For your eyes use Zaditor eyedrops 1 drop in each eye twice a day. Take the prednisone as directed. This will help with your symptoms and with inflammation in your sinuses. Take with food.

## 2016-12-23 NOTE — ED Provider Notes (Signed)
CSN: [id number]     Arrival date & time 12/23/16  1854 History   First MD Initiated Contact with Patient 12/23/16 2002     Chief Complaint  Patient presents with  . Allergies   (Consider location/radiation/quality/duration/timing/severity/associated sxs/prior Treatment) 21 year old female states she has allergy symptoms are getting worse despite taking some over-the-counter medication. Complaining of earaches, sore throat, PND, headache, burning and itching of the eyes, nasal congestion. Denies fever or chills. Medicines taken includes Zyrtec and Benadryl and another medication she cannot recall.      Past Medical History:  Diagnosis Date  . Seasonal allergies    History reviewed. No pertinent surgical history. No family history on file. Social History  Substance Use Topics  . Smoking status: Passive Smoke Exposure - Never Smoker  . Smokeless tobacco: Never Used  . Alcohol use No   OB History    No data available     Review of Systems  Constitutional: Negative for activity change, appetite change, chills, fatigue and fever.  HENT: Positive for congestion, postnasal drip, rhinorrhea and sinus pain. Negative for facial swelling.   Eyes: Negative.   Respiratory: Negative.   Cardiovascular: Negative.   Musculoskeletal: Negative for neck pain and neck stiffness.  Skin: Negative for pallor and rash.  Neurological: Negative.   All other systems reviewed and are negative.   Allergies  Patient has no known allergies.  Home Medications   Prior to Admission medications   Medication Sig Start Date End Date Taking? Authorizing Provider  naproxen (NAPROSYN) 375 MG tablet Take one tablet twice daily as needed for back pain. 09/09/16   Molpus, John, MD  ondansetron (ZOFRAN ODT) 4 MG disintegrating tablet 4mg  ODT q4 hours prn nausea/vomit 07/18/16   Bethann Berkshire, MD  predniSONE (DELTASONE) 50 MG tablet 1 tab po daily for 6 days. Take with food. 12/23/16   Hayden Rasmussen, NP   Meds  Ordered and Administered this Visit  Medications - No data to display  BP 113/68 (BP Location: Right Arm)   Pulse 77   Temp 98.5 F (36.9 C) (Oral)   Resp 16   LMP 12/07/2016 (Within Days)   SpO2 98%  No data found.   Physical Exam  Constitutional: She is oriented to person, place, and time. She appears well-developed and well-nourished. No distress.  HENT:  Mouth/Throat: No oropharyngeal exudate.  Bilateral TMs are normal. Oropharynx with minor erythema, cobblestoning and clear PND.  Eyes: EOM are normal.  Bilateral conjunctivae with erythema and minor swelling to the lower lids. Cannot clear watery drainage.  Neck: Normal range of motion. Neck supple.  Cardiovascular: Normal rate, regular rhythm and normal heart sounds.   Pulmonary/Chest: Effort normal and breath sounds normal. No respiratory distress. She has no wheezes.  Musculoskeletal: Normal range of motion. She exhibits no edema.  Lymphadenopathy:    She has no cervical adenopathy.  Neurological: She is alert and oriented to person, place, and time.  Skin: Skin is warm and dry. No rash noted.  Psychiatric: She has a normal mood and affect.  Nursing note and vitals reviewed.   Urgent Care Course     Procedures (including critical care time)  Labs Review Labs Reviewed - No data to display  Imaging Review No results found.   Visual Acuity Review  Right Eye Distance:   Left Eye Distance:   Bilateral Distance:    Right Eye Near:   Left Eye Near:    Bilateral Near:  MDM   1. Seasonal allergic rhinitis due to pollen   2. Allergic conjunctivitis of both eyes   3. PND (post-nasal drip)    Sudafed PE 10 mg every 4 to 6 hours as needed for congestion Allegra or Zyrtec daily as needed for drainage and runny nose. For stronger antihistamine may take Chlor-Trimeton 2 to 4 mg every 4 to 6 hours, may cause drowsiness. Saline nasal spray used frequently. While you are taking the prednisone do not take  ibuprofen take Tylenol every 4 hours as needed. Drink plenty of fluids and stay well-hydrated. Flonase or Rhinocort nasal spray daily For your eyes use Zaditor eyedrops 1 drop in each eye twice a day. Take the prednisone as directed. This will help with your symptoms and with inflammation in your sinuses. Take with food. Meds ordered this encounter  Medications  . predniSONE (DELTASONE) 50 MG tablet    Sig: 1 tab po daily for 6 days. Take with food.    Dispense:  6 tablet    Refill:  0    Order Specific Question:   Supervising Provider    Answer:   Eustace MooreMURRAY, LAURA W [[id number]]       Hayden RasmussenMabe, Hamp Moreland, NP 12/23/16 2015

## 2017-02-07 ENCOUNTER — Encounter (HOSPITAL_COMMUNITY): Payer: Self-pay | Admitting: Emergency Medicine

## 2017-02-07 ENCOUNTER — Ambulatory Visit (HOSPITAL_COMMUNITY)
Admission: EM | Admit: 2017-02-07 | Discharge: 2017-02-07 | Disposition: A | Discharge status: Home / Self Care | Payer: Medicaid Other | Attending: Internal Medicine | Admitting: Internal Medicine

## 2017-02-07 DIAGNOSIS — S61309A Unspecified open wound of unspecified finger with damage to nail, initial encounter: Secondary | ICD-10-CM

## 2017-02-07 NOTE — ED Provider Notes (Signed)
CSN: [id number]     Arrival date & time 02/07/17  1839 History   None    Chief Complaint  Patient presents with  . Finger Injury   (Consider location/radiation/quality/duration/timing/severity/associated sxs/prior Treatment) Patient c/o right pinky finger nail injury.   The history is provided by the patient.  Hand Pain  This is a new problem. The current episode started 1 to 2 hours ago. The problem occurs constantly. The problem has not changed since onset.Nothing aggravates the symptoms. Nothing relieves the symptoms.    Past Medical History:  Diagnosis Date  . Seasonal allergies    History reviewed. No pertinent surgical history. History reviewed. No pertinent family history. Social History  Substance Use Topics  . Smoking status: Passive Smoke Exposure - Never Smoker  . Smokeless tobacco: Never Used  . Alcohol use No   OB History    No data available     Review of Systems  Constitutional: Negative.   HENT: Negative.   Eyes: Negative.   Respiratory: Negative.   Cardiovascular: Negative.   Gastrointestinal: Negative.   Endocrine: Negative.   Genitourinary: Negative.   Musculoskeletal: Negative.   Skin: Positive for wound.  Allergic/Immunologic: Negative.   Neurological: Negative.   Hematological: Negative.   Psychiatric/Behavioral: Negative.     Allergies  Patient has no known allergies.  Home Medications   Prior to Admission medications   Not on File   Meds Ordered and Administered this Visit  Medications - No data to display  BP 124/81 (BP Location: Left Arm)   Pulse 84   Temp 99.1 F (37.3 C) (Oral)   Resp 16   SpO2 100%  No data found.   Physical Exam  Constitutional: She is oriented to person, place, and time. She appears well-developed and well-nourished.  HENT:  Head: Normocephalic and atraumatic.  Eyes: Conjunctivae and EOM are normal. Pupils are equal, round, and reactive to light.  Neck: Normal range of motion. Neck supple.   Cardiovascular: Normal rate, regular rhythm and normal heart sounds.   Pulmonary/Chest: Effort normal and breath sounds normal.  Neurological: She is alert and oriented to person, place, and time.  Skin:  Right 5th fingernail injury and partial avulsion  Nursing note and vitals reviewed.   Urgent Care Course     .Nail Removal Date/Time: 02/07/2017 8:13 PM Performed by: Deatra Canter Authorized by: Eustace Moore   Consent:    Consent obtained:  Verbal   Consent given by:  Patient   Risks discussed:  Bleeding   Alternatives discussed:  No treatment Location:    Hand:  R ring finger Pre-procedure details:    Skin preparation:  Betadine Anesthesia (see MAR for exact dosages):    Anesthesia method:  Nerve block   Block needle gauge:  27 G   Block anesthetic:  Lidocaine 1% WITH epi   Block technique:  Digital   Block injection procedure:  Anatomic landmarks identified   Block outcome:  Anesthesia achieved Nail Removal:    Nail removed:  Complete   Nail bed repaired: no     Removed nail replaced and anchored: no   Trephination:    Subungual hematoma drained: no   Ingrown nail:    Wedge excision of skin: no     Nail matrix removed or ablated:  None Post-procedure details:    Dressing:  Antibiotic ointment   Patient tolerance of procedure:  Tolerated well, no immediate complications    (including critical care time)  Labs Review Labs  Reviewed - No data to display  Imaging Review No results found.   Visual Acuity Review  Right Eye Distance:   Left Eye Distance:   Bilateral Distance:    Right Eye Near:   Left Eye Near:    Bilateral Near:         MDM   1. Avulsed fingernail, initial encounter    Right 5th fingernail removal  Bacitracin and bandaid applied.    Deatra CanterOxford, William J, OregonFNP 02/07/17 2032

## 2017-02-07 NOTE — ED Triage Notes (Signed)
[facility] a [facility] an injury to the 5th finger on her right hand that occurred earlier today when she hit it on a couch.

## 2017-03-23 ENCOUNTER — Inpatient Hospital Stay (HOSPITAL_COMMUNITY)
Admission: AD | Admit: 2017-03-23 | Discharge: 2017-03-23 | Disposition: A | Discharge status: Home / Self Care | Payer: Medicaid Other | Source: Ambulatory Visit | Attending: Family Medicine | Admitting: Family Medicine

## 2017-03-23 ENCOUNTER — Encounter (HOSPITAL_COMMUNITY): Payer: Self-pay | Admitting: *Deleted

## 2017-03-23 DIAGNOSIS — Z3202 Encounter for pregnancy test, result negative: Secondary | ICD-10-CM | POA: Insufficient documentation

## 2017-03-23 DIAGNOSIS — N946 Dysmenorrhea, unspecified: Secondary | ICD-10-CM | POA: Insufficient documentation

## 2017-03-23 DIAGNOSIS — R109 Unspecified abdominal pain: Secondary | ICD-10-CM | POA: Insufficient documentation

## 2017-03-23 LAB — URINALYSIS, ROUTINE W REFLEX MICROSCOPIC
Bacteria, UA: NONE SEEN
Bilirubin Urine: NEGATIVE
Glucose, UA: NEGATIVE mg/dL
Ketones, ur: NEGATIVE mg/dL
Leukocytes, UA: NEGATIVE
Nitrite: NEGATIVE
Protein, ur: NEGATIVE mg/dL
SQUAMOUS EPITHELIAL / LPF: NONE SEEN
Specific Gravity, Urine: 1.004 — ABNORMAL LOW (ref 1.005–1.030)
pH: 7 (ref 5.0–8.0)

## 2017-03-23 LAB — POCT PREGNANCY, URINE: PREG TEST UR: NEGATIVE

## 2017-03-23 MED ORDER — KETOROLAC TROMETHAMINE 30 MG/ML IJ SOLN
30.0000 mg | Freq: Once | INTRAMUSCULAR | Status: AC
  Administered 2017-03-23: 30 mg via INTRAVENOUS
  Filled 2017-03-23: qty 1

## 2017-03-23 NOTE — Discharge Instructions (Signed)
Dysmenorrhea Dysmenorrhea means painful cramps during your period (menstrual period). You will have pain in your lower belly (abdomen). The pain is caused by the tightening (contracting) of the muscles of the womb (uterus). The pain may be mild or very bad. With this condition, you may:  Have a headache.  Feel sick to your stomach (nauseous).  Throw up (vomit).  Have lower back pain. Follow these instructions at home: Helping pain and cramping   Put heat on your lower back or belly when you have pain or cramps. Use the heat source that your doctor tells you to use.  Place a towel between your skin and the heat.  Leave the heat on for 20-30 minutes.  Remove the heat if your skin turns bright red. This is especially important if you cannot feel pain, heat, or cold.  Do not have a heating pad on during sleep.  Do aerobic exercises. These include walking, swimming, or biking. These may help with cramps.  Massage your lower back or belly. This may help lessen pain. General instructions   Take over-the-counter and prescription medicines only as told by your doctor.  Do not drive or use heavy machinery while taking prescription pain medicine.  Avoid alcohol and caffeine during and right before your period. These can make cramps worse.  Do not use any products that have nicotine or tobacco. These include cigarettes and e-cigarettes. If you need help quitting, ask your doctor.  Keep all follow-up visits as told by your doctor. This is important. Contact a doctor if:  You have pain that gets worse.  You have pain that does not get better with medicine.  You have pain during sex.  You feel sick to your stomach or you throw up during your period, and medicine does not help. Get help right away if:  You pass out (faint). Summary  Dysmenorrhea means painful cramps during your period (menstrual period).  Put heat on your lower back or belly when you have pain or cramps.  Do  exercises like walking, swimming, or biking to help with cramps.  Contact a doctor if you have pain during sex. This information is not intended to replace advice given to you by your health care provider. Make sure you discuss any questions you have with your health care provider. Document Released: 10/21/2008 Document Revised: 08/11/2016 Document Reviewed: 08/11/2016 Elsevier Interactive Patient Education  2017 [employer]  

## 2017-03-23 NOTE — MAU Note (Signed)
Started period yesterday. This month and last, having severe cramps, was on the floor in pain..  Ibuprofen not helping. Pain is severe

## 2017-03-23 NOTE — MAU Provider Note (Signed)
Patient Annette Ferguson is a 21 y.o. G0P0000 Here with complaints of painful period cramps currently and with her last month's period. She is not sexually active; she has not had a pelvic exam before. She wants to get checked out, although she has a Family Medicine doctor that she sees.   The amount of bleeding has nto changed; she does not feel that she is bleeding more than normal.  History     CSN: [id number]  Arrival date and time: 03/23/17 1831   None     Chief Complaint  Patient presents with  . Abdominal Pain  . Back Pain   Abdominal Pain  This is a new problem. The current episode started yesterday. Onset quality: she has been cramping for the past week prior to her menses.  Her menses started yesterday and the cramps got much worse.  Episode frequency: stopped earlier after she took 500 mg of ibuprofen.  The problem has been rapidly worsening. The pain is located in the suprapubic region. The pain is at a severity of 8/10 (back pain is an 8 and cramps are a 7). The quality of the pain is aching and cramping. The abdominal pain does not radiate. Exacerbated by: gets worse at night.  Treatments tried: 500 mg of ibuprofen took a long time to work.  The pain is located in the suprapubic region and in her lower back.    OB History    Gravida Para Term Preterm AB Living   0 0 0 0 0 0   SAB TAB Ectopic Multiple Live Births   0 0 0 0 0      Past Medical History:  Diagnosis Date  . Seasonal allergies     History reviewed. No pertinent surgical history.  Family History  Problem Relation Age of Onset  . Diabetes Father     Social History  Substance Use Topics  . Smoking status: Never Smoker  . Smokeless tobacco: Never Used  . Alcohol use No    Allergies: No Known Allergies  No prescriptions prior to admission.    Review of Systems  Respiratory: Negative.   Cardiovascular: Negative.   Gastrointestinal: Positive for abdominal pain.  Genitourinary: Negative.    Musculoskeletal: Negative.    Physical Exam   Blood pressure 114/73, pulse 79, temperature 98.5 F (36.9 C), temperature source Oral, resp. rate 18, weight 221 lb (100.2 kg), last menstrual period 03/22/2017, SpO2 100 %.  Physical Exam  Constitutional: She is oriented to person, place, and time. She appears well-developed and well-nourished.  HENT:  Head: Normocephalic.  Neck: Normal range of motion.  Respiratory: Effort normal.  GI: Soft.  Musculoskeletal: Normal range of motion.  Neurological: She is alert and oriented to person, place, and time.  Skin: Skin is warm and dry.  Psychiatric: She has a normal mood and affect.    MAU Course  Procedures Results for orders placed or performed during the hospital encounter of 03/23/17 (from the past 24 hour(s))  Urinalysis, Routine w reflex microscopic     Status: Abnormal   Collection Time: 03/23/17  6:44 PM  Result Value Ref Range   Color, Urine STRAW (A) YELLOW   APPearance CLEAR CLEAR   Specific Gravity, Urine 1.004 (L) 1.005 - 1.030   pH 7.0 5.0 - 8.0   Glucose, UA NEGATIVE NEGATIVE mg/dL   Hgb urine dipstick LARGE (A) NEGATIVE   Bilirubin Urine NEGATIVE NEGATIVE   Ketones, ur NEGATIVE NEGATIVE mg/dL   Protein, ur NEGATIVE  NEGATIVE mg/dL   Nitrite NEGATIVE NEGATIVE   Leukocytes, UA NEGATIVE NEGATIVE   RBC / HPF 6-30 0 - 5 RBC/hpf   WBC, UA 0-5 0 - 5 WBC/hpf   Bacteria, UA NONE SEEN NONE SEEN   Squamous Epithelial / LPF NONE SEEN NONE SEEN  Pregnancy, urine POC     Status: None   Collection Time: 03/23/17  8:16 PM  Result Value Ref Range   Preg Test, Ur NEGATIVE NEGATIVE    MDM -30 mg of Toradol IM Patient does not want pelvic exam. Given that she has a regular doctor that she sees, I recommended that she follow up with that doctor. I explained that she was at extremely low risk for any type cancer, and that her periods may be irregular and more painful sometimes. I recommended that she discuss starting birth control  for her dysmenorrhea.   -Recommend that patient take 800 of ibuprofen every 8 hours for today and tomorrow and try heating pad.   Assessment and Plan  A:  1. Dysmenorrhea    P: Discharge home in stable condition Take ibuprofen on schedule for remainder of menses F/u with PCP Discussed reasons to return to MAU  Charlesetta GaribaldiKathryn Lorraine Kooistra 03/23/2017, 9:28 PM

## 2017-03-23 NOTE — MAU Note (Signed)
Pt presents with c/o abdominal cramps & lower pain.  States currently menstruating, reports flow moderate with clots.  States usually has cramping during period, but more severe past two months.  Pt also reports lightheadedness, denies passing out.

## 2017-04-28 DIAGNOSIS — N946 Dysmenorrhea, unspecified: Secondary | ICD-10-CM | POA: Insufficient documentation

## 2017-11-21 ENCOUNTER — Other Ambulatory Visit: Payer: Self-pay

## 2017-11-21 ENCOUNTER — Emergency Department (HOSPITAL_BASED_OUTPATIENT_CLINIC_OR_DEPARTMENT_OTHER)
Admission: EM | Admit: 2017-11-21 | Discharge: 2017-11-21 | Disposition: A | Discharge status: Home / Self Care | Payer: Self-pay | Attending: Emergency Medicine | Admitting: Emergency Medicine

## 2017-11-21 ENCOUNTER — Encounter (HOSPITAL_BASED_OUTPATIENT_CLINIC_OR_DEPARTMENT_OTHER): Payer: Self-pay | Admitting: *Deleted

## 2017-11-21 DIAGNOSIS — R197 Diarrhea, unspecified: Secondary | ICD-10-CM | POA: Insufficient documentation

## 2017-11-21 DIAGNOSIS — R1084 Generalized abdominal pain: Secondary | ICD-10-CM | POA: Insufficient documentation

## 2017-11-21 DIAGNOSIS — R0981 Nasal congestion: Secondary | ICD-10-CM | POA: Insufficient documentation

## 2017-11-21 DIAGNOSIS — R112 Nausea with vomiting, unspecified: Secondary | ICD-10-CM | POA: Insufficient documentation

## 2017-11-21 LAB — URINALYSIS, ROUTINE W REFLEX MICROSCOPIC
BILIRUBIN URINE: NEGATIVE
Glucose, UA: NEGATIVE mg/dL
HGB URINE DIPSTICK: NEGATIVE
Ketones, ur: NEGATIVE mg/dL
Leukocytes, UA: NEGATIVE
Nitrite: NEGATIVE
PH: 6.5 (ref 5.0–8.0)
Protein, ur: NEGATIVE mg/dL

## 2017-11-21 LAB — PREGNANCY, URINE: Preg Test, Ur: NEGATIVE

## 2017-11-21 MED ORDER — ONDANSETRON 4 MG PO TBDP: 4.0000 mg | ORAL_TABLET | ORAL | 0 refills | Status: DC | PRN

## 2017-11-21 MED ORDER — FAMOTIDINE 20 MG PO TABS: 20.0000 mg | ORAL_TABLET | Freq: Two times a day (BID) | ORAL | 0 refills | Status: DC

## 2017-11-21 NOTE — ED Triage Notes (Addendum)
Pt c/o lower abd pain abd back pain n/v/d  x 3 days

## 2017-11-21 NOTE — ED Provider Notes (Signed)
[facility] EMERGENCY DEPARTMENT Provider Note   CSN: [id number] Arrival date & time: 11/21/17  1850     History   Chief Complaint Chief Complaint  Patient presents with  . Abdominal Pain    HPI Annette Ferguson is a 22 y.o. female.  HPI Patient reports she has had an uncomfortable abdomen.  She reports it is like a "tummy ache".  Ports she had a little bit of vomiting and diarrhea.  There was more yesterday but it seems better today.  Reports she is also had some nasal congestion no sore throat.  She reports she thinks as her allergies.  Patient denies she is sexually active.  She denies pain burning urgency with urination.  She denies vaginal discharge or bleeding.  Patient reports she missed work today because of her symptoms. Past Medical History:  Diagnosis Date  . Seasonal allergies     There are no active problems to display for this patient.   History reviewed. No pertinent surgical history.   OB History    Gravida  0   Para  0   Term  0   Preterm  0   AB  0   Living  0     SAB  0   TAB  0   Ectopic  0   Multiple  0   Live Births  0            Home Medications    Prior to Admission medications   Medication Sig Start Date End Date Taking? Authorizing Provider  famotidine (PEPCID) 20 MG tablet Take 1 tablet (20 mg total) by mouth 2 (two) times daily. 11/21/17   Arby BarrettePfeiffer, Chesnee Floren, MD  ondansetron (ZOFRAN ODT) 4 MG disintegrating tablet Take 1 tablet (4 mg total) by mouth every 4 (four) hours as needed for nausea or vomiting. 11/21/17   Arby BarrettePfeiffer, Russie Gulledge, MD    Family History Family History  Problem Relation Age of Onset  . Diabetes Father     Social History Social History   Tobacco Use  . Smoking status: Never Smoker  . Smokeless tobacco: Never Used  Substance Use Topics  . Alcohol use: No  . Drug use: No     Allergies   Patient has no known allergies.   Review of Systems Review of Systems  10 Systems reviewed  and are negative for acute change except as noted in the HPI. Physical Exam Updated Vital Signs BP 117/75 (BP Location: Left Arm)   Pulse 95   Temp 98.4 F (36.9 C) (Oral)   Resp 20   Ht 5\' 3"  (1.6 m)   Wt 104.3 kg (230 lb)   LMP 10/25/2017   SpO2 100%   BMI 40.74 kg/m   Physical Exam  Constitutional: She is oriented to person, place, and time. She appears well-developed and well-nourished.  HENT:  Head: Normocephalic and atraumatic.  Mouth/Throat: Oropharynx is clear and moist.  Eyes: Pupils are equal, round, and reactive to light. EOM are normal.  Neck: Neck supple.  Cardiovascular: Normal rate, regular rhythm, normal heart sounds and intact distal pulses.  Pulmonary/Chest: Effort normal and breath sounds normal.  Abdominal: Soft. Bowel sounds are normal. She exhibits no distension. There is no tenderness.  Musculoskeletal: Normal range of motion. She exhibits no edema.  Neurological: She is alert and oriented to person, place, and time. She has normal strength. Coordination normal. GCS eye subscore is 4. GCS verbal subscore is 5. GCS motor subscore is 6.  Skin: Skin is warm, dry and intact.  Psychiatric: She has a normal mood and affect.     ED Treatments / Results  Labs (all labs ordered are listed, but only abnormal results are displayed) Labs Reviewed  URINALYSIS, ROUTINE W REFLEX MICROSCOPIC - Abnormal; Notable for the following components:      Result Value   Specific Gravity, Urine >1.030 (*)    All other components within normal limits  PREGNANCY, URINE    EKG None  Radiology No results found.  Procedures Procedures (including critical care time)  Medications Ordered in ED Medications - No data to display   Initial Impression / Assessment and Plan / ED Course  I have reviewed the triage vital signs and the nursing notes.  Pertinent labs & imaging results that were available during my care of the patient were reviewed by me and considered in my  medical decision making (see chart for details).      Final Clinical Impressions(s) / ED Diagnoses   Final diagnoses:  Generalized abdominal pain  Nausea vomiting and diarrhea   Patient reports symptoms are improving.  She reports abdominal pain is very mild.  She denies concern for STD.  She denies that she is sexually active.  Patient reports no GU symptoms.  Patient did miss work today and reports she needs an excuse for today and tomorrow.  She is clinically well.  Exam is normal.  At this time I have low suspicion for surgical worse significant infectious etiology.  Findings consistent with mild viral gastroenteritis. ED Discharge Orders        Ordered    ondansetron (ZOFRAN ODT) 4 MG disintegrating tablet  Every 4 hours PRN     11/21/17 2055    famotidine (PEPCID) 20 MG tablet  2 times daily     11/21/17 2055       Arby Barrette, MD 11/21/17 2101

## 2017-11-21 NOTE — ED Notes (Signed)
Patient was unable to sign the d/c instructions because of computer error. The patient states understanding of the instructions

## 2018-02-06 IMAGING — CR DG CHEST 2V
2 series · 2 of 2 positions shown · non-contrast
Comparison: Chest radiograph performed 12/04/2015

CLINICAL DATA: Acute onset of right-sided chest pain. Initial
encounter.

EXAM:
CHEST  2 VIEW

[w chest pa]
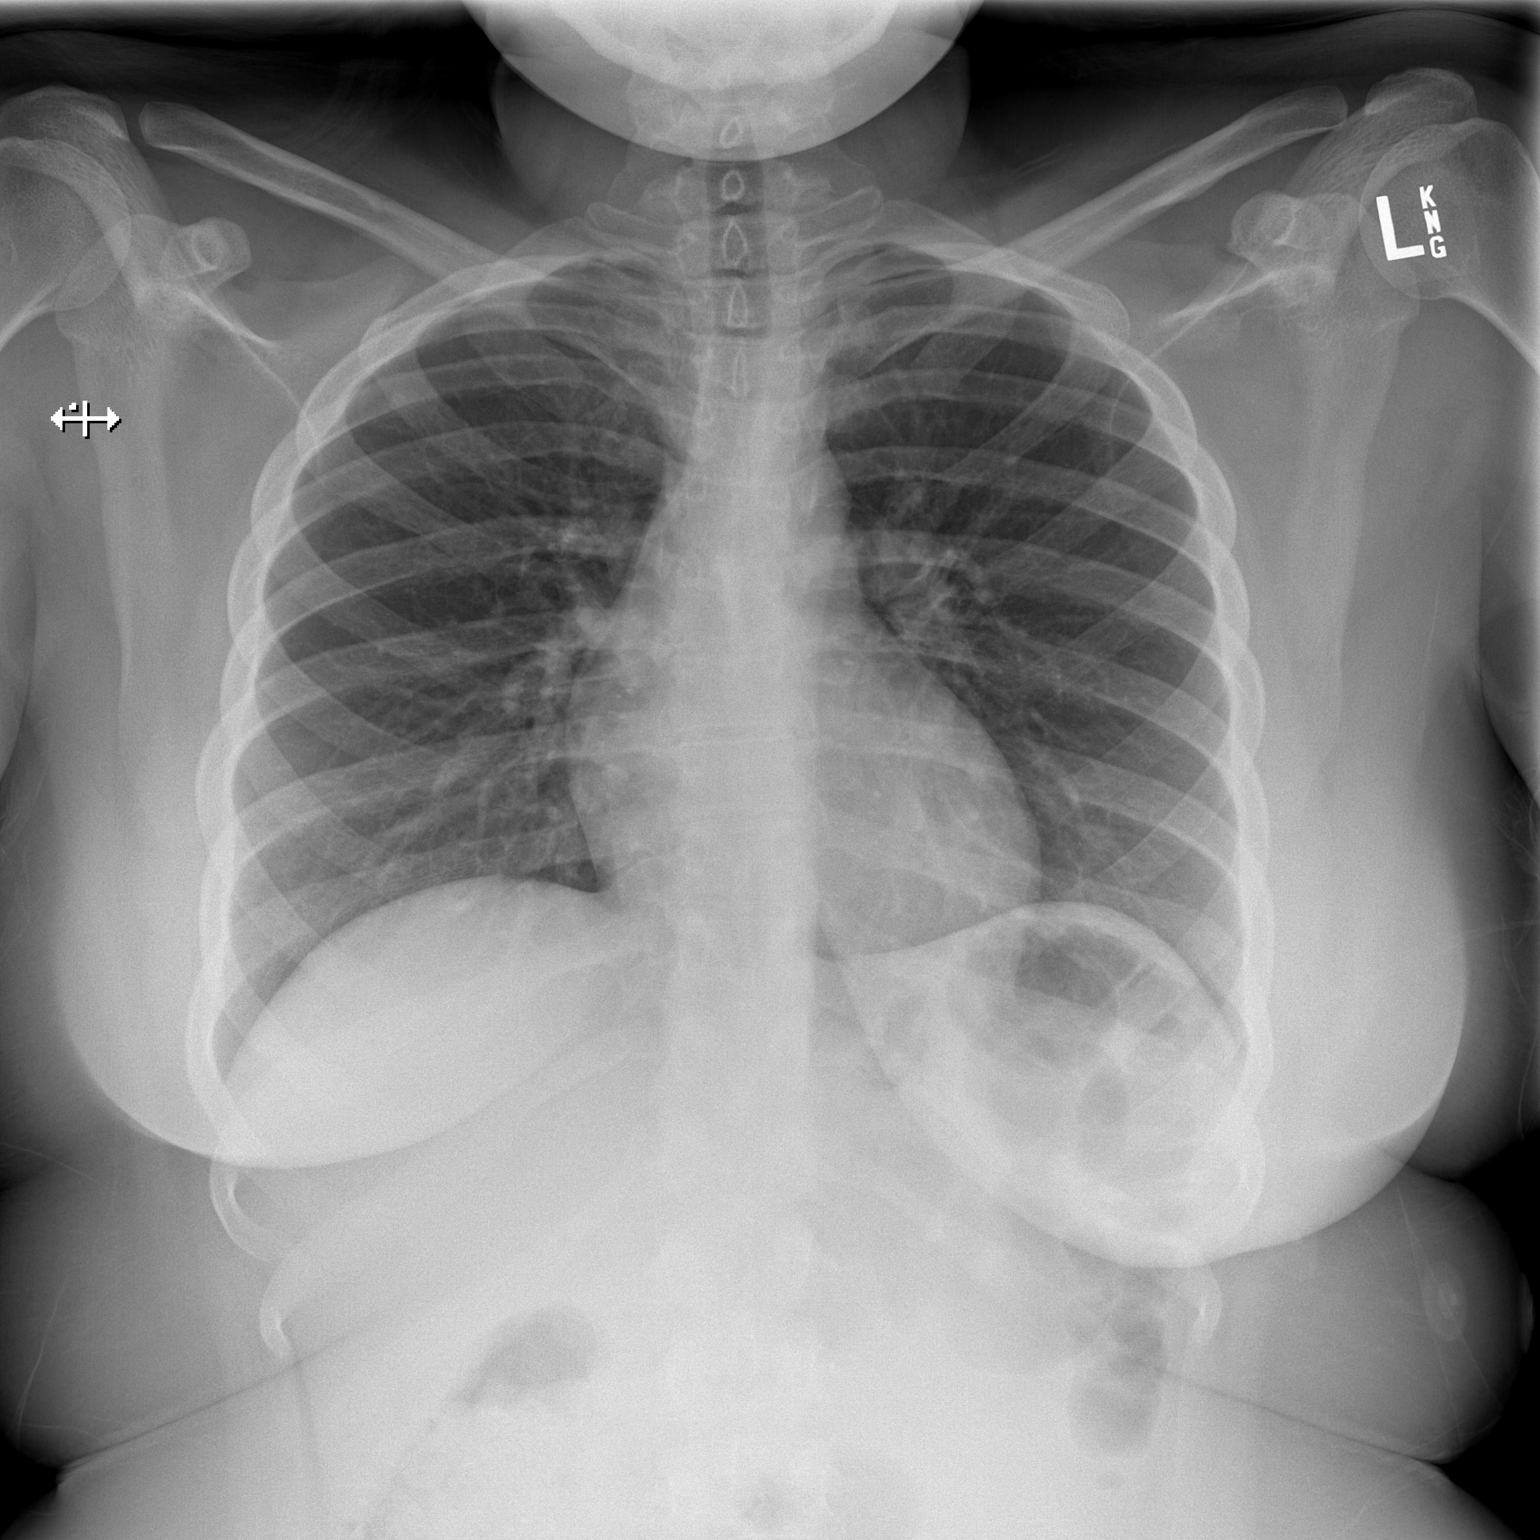

[w chest lat]
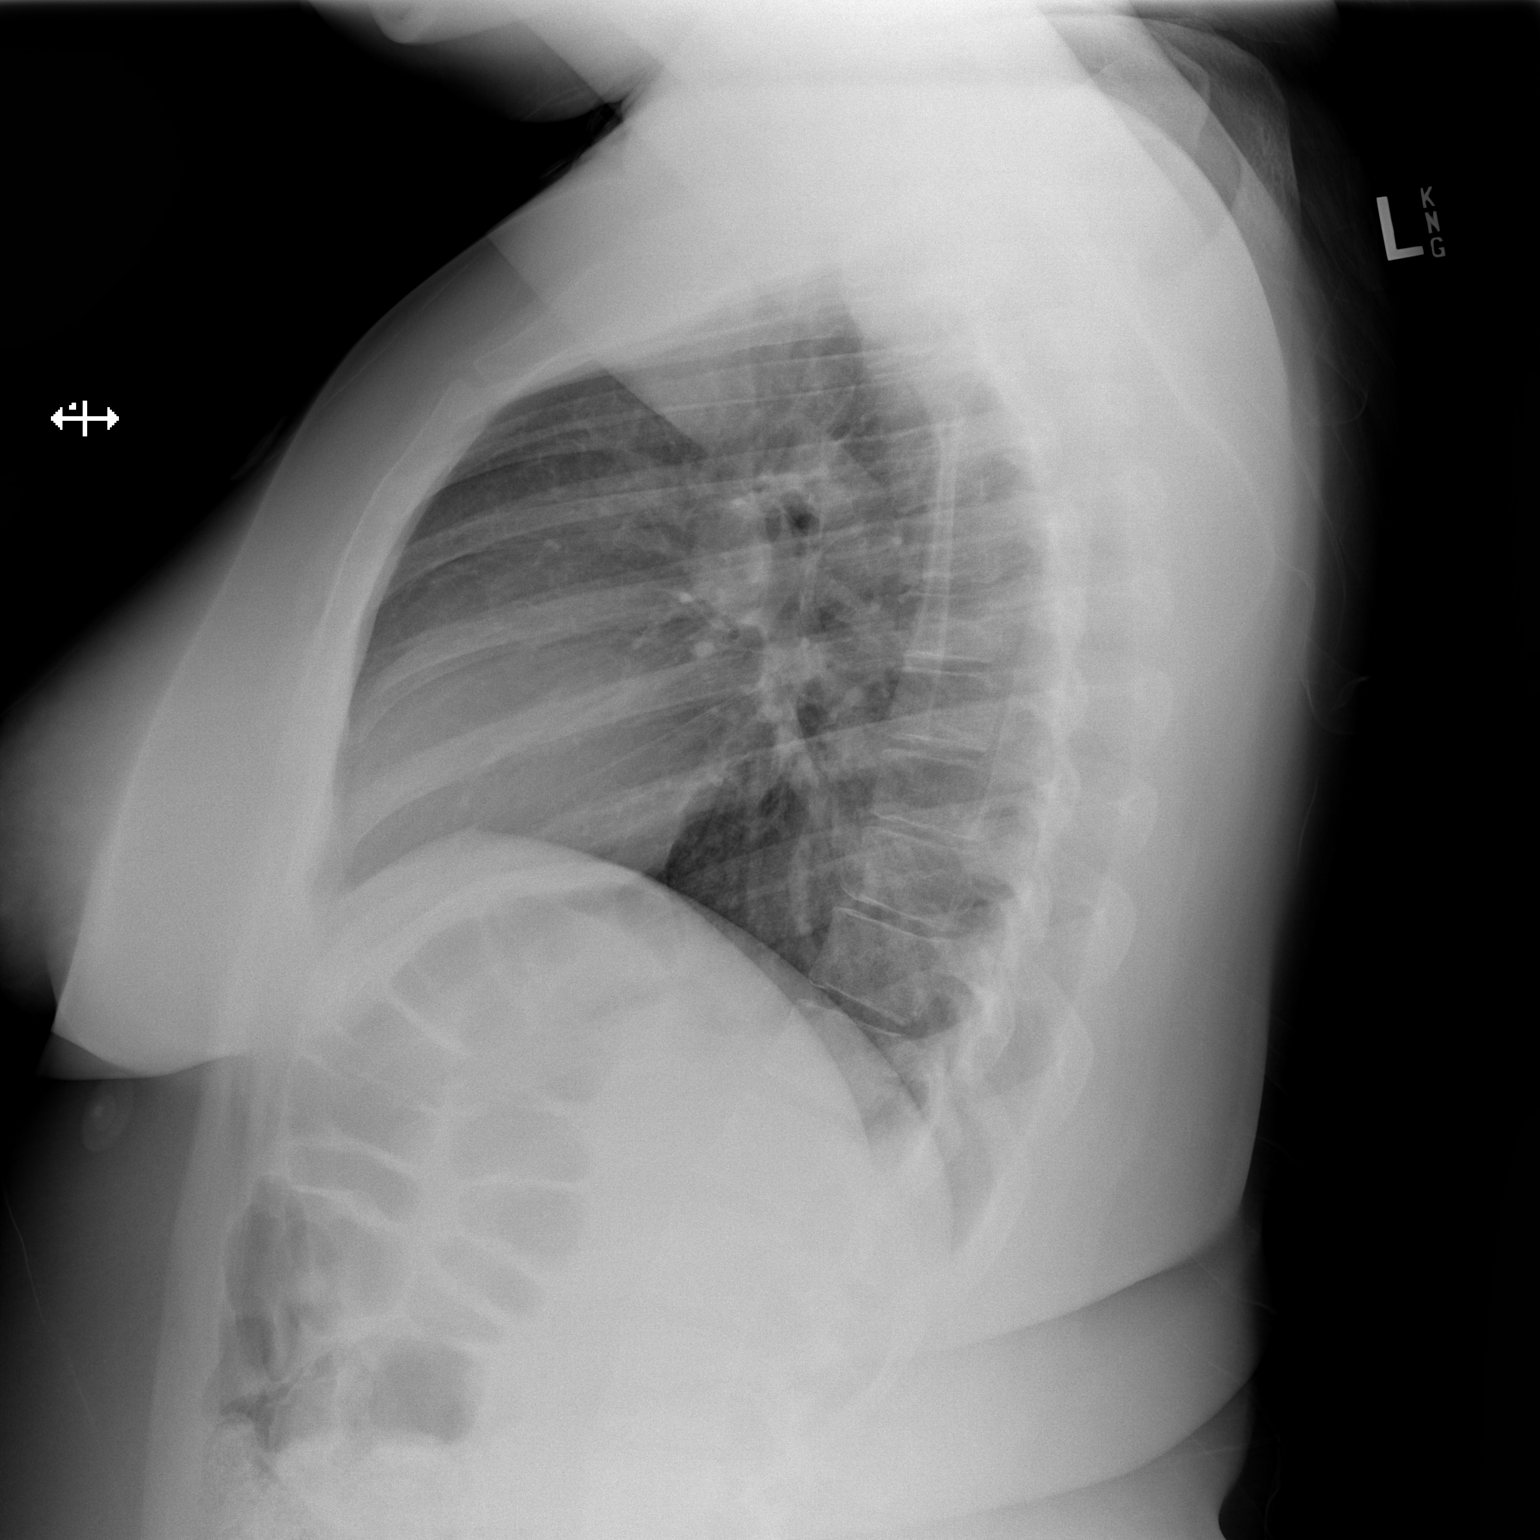

[2 of 2 positions shown; findings below may reference images not displayed]

FINDINGS: The lungs are well-aerated and clear. There is no evidence of focal
opacification, pleural effusion or pneumothorax.

The heart is normal in size; the mediastinal contour is within
normal limits. No acute osseous abnormalities are seen.
IMPRESSION: No acute cardiopulmonary process seen.

## 2018-02-08 ENCOUNTER — Other Ambulatory Visit: Payer: Self-pay

## 2018-02-08 ENCOUNTER — Encounter (HOSPITAL_COMMUNITY): Payer: Self-pay | Admitting: Emergency Medicine

## 2018-02-08 ENCOUNTER — Emergency Department (HOSPITAL_COMMUNITY)
Admission: EM | Admit: 2018-02-08 | Discharge: 2018-02-08 | Disposition: A | Discharge status: Home / Self Care | Payer: PRIVATE HEALTH INSURANCE | Attending: Emergency Medicine | Admitting: Emergency Medicine

## 2018-02-08 DIAGNOSIS — Y990 Civilian activity done for income or pay: Secondary | ICD-10-CM | POA: Diagnosis not present

## 2018-02-08 DIAGNOSIS — S0990XA Unspecified injury of head, initial encounter: Secondary | ICD-10-CM | POA: Diagnosis present

## 2018-02-08 DIAGNOSIS — Z79899 Other long term (current) drug therapy: Secondary | ICD-10-CM | POA: Diagnosis not present

## 2018-02-08 DIAGNOSIS — Y92512 Supermarket, store or market as the place of occurrence of the external cause: Secondary | ICD-10-CM | POA: Insufficient documentation

## 2018-02-08 DIAGNOSIS — W208XXA Other cause of strike by thrown, projected or falling object, initial encounter: Secondary | ICD-10-CM | POA: Diagnosis not present

## 2018-02-08 DIAGNOSIS — Y939 Activity, unspecified: Secondary | ICD-10-CM | POA: Insufficient documentation

## 2018-02-08 MED ORDER — IBUPROFEN 800 MG PO TABS
800.0000 mg | ORAL_TABLET | Freq: Once | ORAL | Status: AC
Start: 2018-02-08 — End: 2018-02-08
  Administered 2018-02-08: 800 mg via ORAL
  Filled 2018-02-08: qty 1

## 2018-02-08 NOTE — Discharge Instructions (Addendum)
Please read attached information. If you experience any new or worsening signs or symptoms please return to the emergency room for evaluation. Please follow-up with your primary care provider or specialist as discussed.  °
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
## 2018-02-08 NOTE — ED Provider Notes (Signed)
[facility] EMERGENCY [facility]PARTMENT Provider Note   CSN: [id number] Arrival date & time: 02/08/18  1250     History   Chief Complaint Chief Complaint  Patient presents with  . Head Injury    HPI Annette Ferguson is a 22 y.o. female.  HPI   22 year old female presents today with complaints of head injury. Patient she was working at [employer] when shelf fell on the top of her head yesterday. She has very minimal pain to the top of her head. She notes waking up this morning with worsening headache, worsening pain on top of her head and left lateral neck pain. She denies any neurological deficits, nausea vomiting, or any signs trauma to her head. Patient does note that she has daycare overlying the area and is unwilling to take it off to evaluate the scalp. Patient is taking ibuprofen yesterday which improved her symptoms, no medications prior to arrival today.      Past Medical History:  Diagnosis Date  . Seasonal allergies     There are no active problems to display for this patient.   History reviewed. No pertinent surgical history.   OB History    Gravida  0   Para  0   Term  0   Preterm  0   AB  0   Living  0     SAB  0   TAB  0   Ectopic  0   Multiple  0   Live Births  0            Home Medications    Prior to Admission medications   Medication Sig Start Date End Date Taking? Authorizing Provider  famotidine (PEPCID) 20 MG tablet Take 1 tablet (20 mg total) by mouth 2 (two) times daily. 11/21/17   Arby BarrettePfeiffer, Marcy, MD  ondansetron (ZOFRAN ODT) 4 MG disintegrating tablet Take 1 tablet (4 mg total) by mouth every 4 (four) hours as needed for nausea or vomiting. 11/21/17   Arby BarrettePfeiffer, Marcy, MD    Family History Family History  Problem Relation Age of Onset  . Diabetes Father     Social History Social History   Tobacco Use  . Smoking status: Never Smoker  . Smokeless tobacco: Never Used  Substance Use Topics  . Alcohol  use: No  . Drug use: No     Allergies   Patient has no known allergies.   Review of Systems Review of Systems  All other systems reviewed and are negative.    Physical Exam Updated Vital Signs BP 124/74   Pulse 96   Temp 99.4 F (37.4 C) (Oral)   Resp 16   Ht 5\' 3"  (1.6 m)   Wt 104.3 kg (230 lb)   LMP 01/15/2018   SpO2 99%   BMI 40.74 kg/m   Physical Exam  Constitutional: She is oriented to person, place, and time. She appears well-developed and well-nourished.  HENT:  Head: Normocephalic and atraumatic.  Eyes: Pupils are equal, round, and reactive to light. Conjunctivae are normal. Right eye exhibits no discharge. Left eye exhibits no discharge. No scleral icterus.  Neck: Normal range of motion. No JVD present. No tracheal deviation present.  Pulmonary/Chest: Effort normal. No stridor.  Musculoskeletal:  No CT or L-spine tenderness palpation, tenderness palpation of left lateral cervical musculature- bilateral upper and lower extremity sensation strength or motor function intact  Neurological: She is alert and oriented to person, place, and time. She has normal strength. No  cranial nerve deficit or sensory deficit. Coordination normal. GCS eye subscore is 4. GCS verbal subscore is 5. GCS motor subscore is 6.  Psychiatric: She has a normal mood and affect. Her behavior is normal. Judgment and thought content normal.  Nursing note and vitals reviewed.    ED Treatments / Results  Labs (all labs ordered are listed, but only abnormal results are displayed) Labs Reviewed - No data to display  EKG None  Radiology No results found.  Procedures Procedures (including critical care time)  Medications Ordered in ED Medications  ibuprofen (ADVIL,MOTRIN) tablet 800 mg (800 mg Oral Given 02/08/18 1401)     Initial Impression / Assessment and Plan / ED Course  I have reviewed the triage vital signs and the nursing notes.  Pertinent labs & imaging results that were  available during my care of the patient were reviewed by me and considered in my medical decision making (see chart for details).    Labs:   Imaging:  Consults:  Therapeutics:  Discharge Meds:   Assessment/Plan: 22 year old female presents today with head injury. She has no significant signs of injury on exam although she will not take off her hair to evaluate the scalp I have low suspicion for underlying laceration. She has no neurological deficits no need for further evaluation at this time. Patient discharged with strict return precautions INFLAMMATION. She verbalized understanding and agreement to today's plan.      Final Clinical Impressions(s) / ED Diagnoses   Final diagnoses:  Injury of head, initial encounter    ED Discharge Orders    None       Rosalio Loud 02/08/18 1447    Terrilee Files, MD 02/09/18 [phone]

## 2018-02-08 NOTE — ED Triage Notes (Signed)
Pt. Stated, I had a rack to hit on top of my head at [employer] and no cut just real sore.

## 2018-02-13 DIAGNOSIS — R809 Proteinuria, unspecified: Secondary | ICD-10-CM | POA: Insufficient documentation

## 2018-03-26 ENCOUNTER — Other Ambulatory Visit: Payer: Self-pay

## 2018-03-26 ENCOUNTER — Encounter (HOSPITAL_COMMUNITY): Payer: Self-pay

## 2018-03-26 ENCOUNTER — Emergency Department (HOSPITAL_COMMUNITY)
Admission: EM | Admit: 2018-03-26 | Discharge: 2018-03-26 | Disposition: A | Discharge status: Home / Self Care | Payer: Self-pay | Attending: Emergency Medicine | Admitting: Emergency Medicine

## 2018-03-26 DIAGNOSIS — Z79899 Other long term (current) drug therapy: Secondary | ICD-10-CM | POA: Insufficient documentation

## 2018-03-26 DIAGNOSIS — N898 Other specified noninflammatory disorders of vagina: Secondary | ICD-10-CM | POA: Insufficient documentation

## 2018-03-26 LAB — URINALYSIS, ROUTINE W REFLEX MICROSCOPIC
BILIRUBIN URINE: NEGATIVE
Glucose, UA: NEGATIVE mg/dL
HGB URINE DIPSTICK: NEGATIVE
KETONES UR: NEGATIVE mg/dL
Leukocytes, UA: NEGATIVE
NITRITE: NEGATIVE
Protein, ur: NEGATIVE mg/dL
SPECIFIC GRAVITY, URINE: 1.015 (ref 1.005–1.030)
pH: 6 (ref 5.0–8.0)

## 2018-03-26 LAB — WET PREP, GENITAL
Clue Cells Wet Prep HPF POC: NONE SEEN
SPERM: NONE SEEN
TRICH WET PREP: NONE SEEN
Yeast Wet Prep HPF POC: NONE SEEN

## 2018-03-26 LAB — PREGNANCY, URINE: PREG TEST UR: NEGATIVE

## 2018-03-26 LAB — GC/CHLAMYDIA PROBE AMP (~~LOC~~) NOT AT ARMC
CHLAMYDIA, DNA PROBE: NEGATIVE
Neisseria Gonorrhea: NEGATIVE

## 2018-03-26 NOTE — ED Provider Notes (Signed)
[facility]-EMERGENCY DEPT Provider Note   CSN: [id number] Arrival date & time: 03/26/18  0719     History   Chief Complaint Chief Complaint  Patient presents with  . Nausea  . Vaginitis    HPI Annette Ferguson is a 22 y.o. female.  562 22-year-old female complains of nausea and vaginal irritation times several weeks.  Menstrual period was 9 days ago.  Treated for UTI several weeks ago which did not improve her symptoms.  She has not had any vaginal bleeding or discharge.  Does note irritation in her perineum when she wipes.  No fever or chills.  Has been treated with an antifungal cream in the past.     Past Medical History:  Diagnosis Date  . Seasonal allergies     There are no active problems to display for this patient.   History reviewed. No pertinent surgical history.   OB History    Gravida  0   Para  0   Term  0   Preterm  0   AB  0   Living  0     SAB  0   TAB  0   Ectopic  0   Multiple  0   Live Births  0            Home Medications    Prior to Admission medications   Medication Sig Start Date End Date Taking? Authorizing Provider  famotidine (PEPCID) 20 MG tablet Take 1 tablet (20 mg total) by mouth 2 (two) times daily. 11/21/17   Arby BarrettePfeiffer, Marcy, MD  ondansetron (ZOFRAN ODT) 4 MG disintegrating tablet Take 1 tablet (4 mg total) by mouth every 4 (four) hours as needed for nausea or vomiting. 11/21/17   Arby BarrettePfeiffer, Marcy, MD    Family History Family History  Problem Relation Age of Onset  . Diabetes Father     Social History Social History   Tobacco Use  . Smoking status: Never Smoker  . Smokeless tobacco: Never Used  Substance Use Topics  . Alcohol use: No  . Drug use: No     Allergies   Patient has no known allergies.   Review of Systems Review of Systems  All other systems reviewed and are negative.    Physical Exam Updated Vital Signs BP 129/88 (BP Location: Right Arm)   Pulse 77    Temp 98.5 F (36.9 C) (Oral)   Resp 18   Ht 1.6 m (5\' 3" )   Wt 104.3 kg   SpO2 100%   BMI 40.74 kg/m   Physical Exam  Constitutional: She is oriented to person, place, and time. She appears well-developed and well-nourished.  Non-toxic appearance. No distress.  HENT:  Head: Normocephalic and atraumatic.  Eyes: Pupils are equal, round, and reactive to light. Conjunctivae, EOM and lids are normal.  Neck: Normal range of motion. Neck supple. No tracheal deviation present. No thyroid mass present.  Cardiovascular: Normal rate, regular rhythm and normal heart sounds. Exam reveals no gallop.  No murmur heard. Pulmonary/Chest: Effort normal and breath sounds normal. No stridor. No respiratory distress. She has no decreased breath sounds. She has no wheezes. She has no rhonchi. She has no rales.  Abdominal: Soft. Normal appearance and bowel sounds are normal. She exhibits no distension. There is no tenderness. There is no rebound and no CVA tenderness.  Genitourinary: There is no rash on the right labia. There is no rash on the left labia. No tenderness or bleeding  in the vagina. No vaginal discharge found.  Musculoskeletal: Normal range of motion. She exhibits no edema or tenderness.  Neurological: She is alert and oriented to person, place, and time. She has normal strength. No cranial nerve deficit or sensory deficit. GCS eye subscore is 4. GCS verbal subscore is 5. GCS motor subscore is 6.  Skin: Skin is warm and dry. No abrasion and no rash noted.  Psychiatric: She has a normal mood and affect. Her speech is normal and behavior is normal.  Nursing note and vitals reviewed.    ED Treatments / Results  Labs (all labs ordered are listed, but only abnormal results are displayed) Labs Reviewed  URINE CULTURE  WET PREP, GENITAL  URINALYSIS, ROUTINE W REFLEX MICROSCOPIC  PREGNANCY, URINE  GC/CHLAMYDIA PROBE AMP ([place]) NOT AT [facility]    EKG None  Radiology No results  found.  Procedures Procedures (including critical care time)  Medications Ordered in ED Medications - No data to display   Initial Impression / Assessment and Plan / ED Course  I have reviewed the triage vital signs and the nursing notes.  Pertinent labs & imaging results that were available during my care of the patient were reviewed by me and considered in my medical decision making (see chart for details).     Wet prep negative for signs of infection.  She denies sexual activity as she is a virgin.  Urinalysis negative.  She has no visible signs of infection of her perineum.  We will refer her back to her GYN doctor Final Clinical Impressions(s) / ED Diagnoses   Final diagnoses:  None    ED Discharge Orders    None       Lorre NickAllen, Raekwon Winkowski, MD 03/26/18 [phone]

## 2018-03-26 NOTE — ED Triage Notes (Signed)
Pt c/o nausea and vaginal irritation. Prior UTI a week ago, and stated she was given anti fungal cream for vaginal infection.

## 2018-03-26 NOTE — Discharge Instructions (Addendum)
Follow with your gynecologist.  Return here if you develop fever or lower abdominal pain.

## 2018-03-27 LAB — URINE CULTURE: Culture: NO GROWTH

## 2018-04-07 ENCOUNTER — Encounter (HOSPITAL_COMMUNITY): Payer: Self-pay

## 2018-04-07 ENCOUNTER — Emergency Department (HOSPITAL_COMMUNITY)
Admission: EM | Admit: 2018-04-07 | Discharge: 2018-04-07 | Disposition: A | Discharge status: Home / Self Care | Payer: PRIVATE HEALTH INSURANCE | Attending: Emergency Medicine | Admitting: Emergency Medicine

## 2018-04-07 ENCOUNTER — Other Ambulatory Visit: Payer: Self-pay

## 2018-04-07 ENCOUNTER — Emergency Department (HOSPITAL_COMMUNITY): Payer: PRIVATE HEALTH INSURANCE

## 2018-04-07 DIAGNOSIS — R51 Headache: Secondary | ICD-10-CM | POA: Insufficient documentation

## 2018-04-07 DIAGNOSIS — F0781 Postconcussional syndrome: Secondary | ICD-10-CM

## 2018-04-07 DIAGNOSIS — Z79899 Other long term (current) drug therapy: Secondary | ICD-10-CM | POA: Insufficient documentation

## 2018-04-07 MED ORDER — KETOROLAC TROMETHAMINE 60 MG/2ML IM SOLN
60.0000 mg | Freq: Once | INTRAMUSCULAR | Status: DC
  Filled 2018-04-07: qty 2

## 2018-04-07 MED ORDER — METHOCARBAMOL 750 MG PO TABS: 750.0000 mg | ORAL_TABLET | Freq: Four times a day (QID) | ORAL | 0 refills | Status: DC

## 2018-04-07 MED ORDER — OXYCODONE-ACETAMINOPHEN 5-325 MG PO TABS
1.0000 | ORAL_TABLET | Freq: Once | ORAL | Status: AC
  Administered 2018-04-07: 1 via ORAL
  Filled 2018-04-07: qty 1

## 2018-04-07 MED ORDER — DIAZEPAM 5 MG PO TABS
5.0000 mg | ORAL_TABLET | Freq: Once | ORAL | Status: AC
  Administered 2018-04-07: 5 mg via ORAL
  Filled 2018-04-07: qty 1

## 2018-04-07 NOTE — ED Triage Notes (Signed)
Pt states she has been having bad headaches since July 4. Pt states she had a steel rack fall on her head that day. Pt states pain radiates to her right shoulder. Pt states headache in forehead and top of head.

## 2018-04-07 NOTE — ED Notes (Signed)
Patient transported to CT 

## 2018-04-07 NOTE — ED Notes (Signed)
ED Provider at bedside. 

## 2018-04-07 NOTE — ED Notes (Signed)
Pt using restroom providing urine specimen. Will be placed at bedside until order placed.

## 2018-04-07 NOTE — ED Provider Notes (Signed)
[facility]-EMERGENCY DEPT Provider Note   CSN: [id number] Arrival date & time: 04/07/18  1018     History   Chief Complaint Chief Complaint  Patient presents with  . Headache    HPI RITA VIALPANDO is a 22 y.o. female.  22 year old female presents with several month history of bifrontal headache as well as neck discomfort after a metal grate fell on her head at work.  Has been seen for work comp for this and they may treat her with meds.  She states that she has not had any imaging.  Denies any severe ataxia, no emesis, no new focal weakness.  No photophobia.  Neck pain is dull and worse with movement of her head.  No associated fever.  Came in today because her clinic is closed     Past Medical History:  Diagnosis Date  . Seasonal allergies     There are no active problems to display for this patient.   History reviewed. No pertinent surgical history.   OB History    Gravida  0   Para  0   Term  0   Preterm  0   AB  0   Living  0     SAB  0   TAB  0   Ectopic  0   Multiple  0   Live Births  0            Home Medications    Prior to Admission medications   Medication Sig Start Date End Date Taking? Authorizing Provider  methocarbamol (ROBAXIN) 500 MG tablet Take 1,000 mg by mouth every 8 (eight) hours as needed for muscle spasms.  02/18/18  Yes [provider]  SPRINTEC 28 0.25-35 MG-MCG tablet Take 1 tablet by mouth daily. 02/13/18  Yes [provider]  traMADol (ULTRAM) 50 MG tablet Take 50 mg by mouth 4 (four) times daily as needed for moderate pain.  03/05/18  Yes [provider]  famotidine (PEPCID) 20 MG tablet Take 1 tablet (20 mg total) by mouth 2 (two) times daily. Patient not taking: Reported on 03/26/2018 11/21/17   Arby Barrette, MD  ondansetron (ZOFRAN ODT) 4 MG disintegrating tablet Take 1 tablet (4 mg total) by mouth every 4 (four) hours as needed for nausea or vomiting. Patient not  taking: Reported on 03/26/2018 11/21/17   Arby Barrette, MD    Family History Family History  Problem Relation Age of Onset  . Diabetes Father     Social History Social History   Tobacco Use  . Smoking status: Never Smoker  . Smokeless tobacco: Never Used  Substance Use Topics  . Alcohol use: No  . Drug use: No     Allergies   Patient has no known allergies.   Review of Systems Review of Systems  All other systems reviewed and are negative.    Physical Exam Updated Vital Signs BP 129/74 (BP Location: Right Arm)   Pulse 94   Temp 98.7 F (37.1 C) (Oral)   Resp 17   Ht 1.6 m (5\' 3" )   Wt 104.3 kg   LMP 04/02/2018   SpO2 100%   BMI 40.74 kg/m   Physical Exam  Constitutional: She is oriented to person, place, and time. She appears well-developed and well-nourished.  Non-toxic appearance. No distress.  HENT:  Head: Normocephalic and atraumatic.  Eyes: Pupils are equal, round, and reactive to light. Conjunctivae, EOM and lids are normal.  Neck: Normal range of  motion. Neck supple. Muscular tenderness present. No tracheal deviation present. No thyroid mass present.    Cardiovascular: Normal rate, regular rhythm and normal heart sounds. Exam reveals no gallop.  No murmur heard. Pulmonary/Chest: Effort normal and breath sounds normal. No stridor. No respiratory distress. She has no decreased breath sounds. She has no wheezes. She has no rhonchi. She has no rales.  Abdominal: Soft. Normal appearance and bowel sounds are normal. She exhibits no distension. There is no tenderness. There is no rebound and no CVA tenderness.  Musculoskeletal: Normal range of motion. She exhibits no edema or tenderness.  Neurological: She is alert and oriented to person, place, and time. She has normal strength. No cranial nerve deficit or sensory deficit. GCS eye subscore is 4. GCS verbal subscore is 5. GCS motor subscore is 6.  Skin: Skin is warm and dry. No abrasion and no rash noted.    Psychiatric: She has a normal mood and affect. Her speech is normal and behavior is normal.  Nursing note and vitals reviewed.    ED Treatments / Results  Labs (all labs ordered are listed, but only abnormal results are displayed) Labs Reviewed - No data to display  EKG None  Radiology No results found.  Procedures Procedures (including critical care time)  Medications Ordered in ED Medications  diazepam (VALIUM) tablet 5 mg (has no administration in time range)  oxyCODONE-acetaminophen (PERCOCET/ROXICET) 5-325 MG per tablet 1 tablet (has no administration in time range)     Initial Impression / Assessment and Plan / ED Course  I have reviewed the triage vital signs and the nursing notes.  Pertinent labs & imaging results that were available during my care of the patient were reviewed by me and considered in my medical decision making (see chart for details).     Patient medicated for pain and feels better.  Head and neck CT without acute findings.  Stable for discharge  Final Clinical Impressions(s) / ED Diagnoses   Final diagnoses:  None    ED Discharge Orders    None       Lorre NickAllen, Kenli Waldo, MD 04/07/18 1156

## 2018-04-30 DIAGNOSIS — M542 Cervicalgia: Secondary | ICD-10-CM | POA: Insufficient documentation

## 2018-05-31 ENCOUNTER — Other Ambulatory Visit: Payer: Self-pay

## 2018-05-31 ENCOUNTER — Encounter (HOSPITAL_COMMUNITY): Payer: Self-pay | Admitting: Emergency Medicine

## 2018-05-31 ENCOUNTER — Emergency Department (HOSPITAL_COMMUNITY)
Admission: EM | Admit: 2018-05-31 | Discharge: 2018-05-31 | Disposition: A | Discharge status: Home / Self Care | Payer: Self-pay | Attending: Emergency Medicine | Admitting: Emergency Medicine

## 2018-05-31 ENCOUNTER — Emergency Department (HOSPITAL_COMMUNITY): Payer: Self-pay

## 2018-05-31 DIAGNOSIS — J302 Other seasonal allergic rhinitis: Secondary | ICD-10-CM | POA: Insufficient documentation

## 2018-05-31 DIAGNOSIS — J020 Streptococcal pharyngitis: Secondary | ICD-10-CM | POA: Insufficient documentation

## 2018-05-31 DIAGNOSIS — Z79899 Other long term (current) drug therapy: Secondary | ICD-10-CM | POA: Insufficient documentation

## 2018-05-31 LAB — CBC WITH DIFFERENTIAL/PLATELET
ABS IMMATURE GRANULOCYTES: 0.03 10*3/uL (ref 0.00–0.07)
BASOS ABS: 0 10*3/uL (ref 0.0–0.1)
Basophils Relative: 0 %
Eosinophils Absolute: 0 10*3/uL (ref 0.0–0.5)
Eosinophils Relative: 0 %
HCT: 39.1 % (ref 36.0–46.0)
HEMOGLOBIN: 12.1 g/dL (ref 12.0–15.0)
Immature Granulocytes: 0 %
LYMPHS ABS: 1.2 10*3/uL (ref 0.7–4.0)
Lymphocytes Relative: 11 %
MCH: 27.7 pg (ref 26.0–34.0)
MCHC: 30.9 g/dL (ref 30.0–36.0)
MCV: 89.5 fL (ref 80.0–100.0)
MONO ABS: 1.1 10*3/uL — AB (ref 0.1–1.0)
Monocytes Relative: 10 %
NEUTROS ABS: 9 10*3/uL — AB (ref 1.7–7.7)
NRBC: 0 % (ref 0.0–0.2)
Neutrophils Relative %: 79 %
Platelets: 318 10*3/uL (ref 150–400)
RBC: 4.37 MIL/uL (ref 3.87–5.11)
RDW: 12.5 % (ref 11.5–15.5)
WBC: 11.3 10*3/uL — AB (ref 4.0–10.5)

## 2018-05-31 LAB — COMPREHENSIVE METABOLIC PANEL
ALK PHOS: 40 U/L (ref 38–126)
ALT: 22 U/L (ref 0–44)
AST: 18 U/L (ref 15–41)
Albumin: 3.4 g/dL — ABNORMAL LOW (ref 3.5–5.0)
Anion gap: 9 (ref 5–15)
BUN: 6 mg/dL (ref 6–20)
CHLORIDE: 103 mmol/L (ref 98–111)
CO2: 24 mmol/L (ref 22–32)
Calcium: 8.9 mg/dL (ref 8.9–10.3)
Creatinine, Ser: 0.84 mg/dL (ref 0.44–1.00)
GFR calc non Af Amer: >60 mL/min (ref >60)
Glucose, Bld: 100 mg/dL — ABNORMAL HIGH (ref 70–99)
POTASSIUM: 3.4 mmol/L — AB (ref 3.5–5.1)
SODIUM: 136 mmol/L (ref 135–145)
Total Bilirubin: 0.6 mg/dL (ref 0.3–1.2)
Total Protein: 7.4 g/dL (ref 6.5–8.1)

## 2018-05-31 LAB — GROUP A STREP BY PCR: Group A Strep by PCR: DETECTED — AB

## 2018-05-31 LAB — I-STAT BETA HCG BLOOD, ED (MC, WL, AP ONLY)

## 2018-05-31 LAB — I-STAT CG4 LACTIC ACID, ED: Lactic Acid, Venous: 0.8 mmol/L (ref 0.5–1.9)

## 2018-05-31 MED ORDER — DEXAMETHASONE SODIUM PHOSPHATE 10 MG/ML IJ SOLN
10.0000 mg | Freq: Once | INTRAMUSCULAR | Status: AC
  Administered 2018-05-31: 10 mg via INTRAMUSCULAR
  Filled 2018-05-31: qty 1

## 2018-05-31 MED ORDER — ACETAMINOPHEN 325 MG PO TABS
650.0000 mg | ORAL_TABLET | Freq: Once | ORAL | Status: AC | PRN
  Administered 2018-05-31: 650 mg via ORAL
  Filled 2018-05-31: qty 2

## 2018-05-31 MED ORDER — PENICILLIN G BENZATHINE 1200000 UNIT/2ML IM SUSP
1.2000 10*6.[IU] | Freq: Once | INTRAMUSCULAR | Status: AC
  Administered 2018-05-31: 1.2 10*6.[IU] via INTRAMUSCULAR
  Filled 2018-05-31: qty 2

## 2018-05-31 NOTE — ED Triage Notes (Signed)
Sore throat - body aches, chills- started yesterday. Has taken otc meds without relief-

## 2018-05-31 NOTE — ED Provider Notes (Signed)
[facility] EMERGENCY DEPARTMENT Provider Note   CSN: [id number] Arrival date & time: 05/31/18  8295     History   Chief Complaint Chief Complaint  Patient presents with  . flu sx  . Generalized Body Aches    HPI Annette Ferguson is a 22 y.o. female without significant past medical hx who presents to the ED with complaint of sore throat which started last evening.  Patient states that her pain is constant, 10 out of 10 in severity, worse with swallowing but she is able to swallow.  No alleviating factors.  She has tried some type of cough syrup without significant change.  She reports associated congestion, bilateral ear discomfort, as well as chills and body aches.  Has not taken her temperature at home to determine if she has a fever.  Denies cough, dyspnea, inability to swallow, change in her voice, or drooling.  HPI  Past Medical History:  Diagnosis Date  . Seasonal allergies     There are no active problems to display for this patient.   History reviewed. No pertinent surgical history.   OB History    Gravida  0   Para  0   Term  0   Preterm  0   AB  0   Living  0     SAB  0   TAB  0   Ectopic  0   Multiple  0   Live Births  0            Home Medications    Prior to Admission medications   Medication Sig Start Date End Date Taking? Authorizing Provider  famotidine (PEPCID) 20 MG tablet Take 1 tablet (20 mg total) by mouth 2 (two) times daily. Patient not taking: Reported on 03/26/2018 11/21/17   Arby Barrette, MD  methocarbamol (ROBAXIN) 500 MG tablet Take 1,000 mg by mouth every 8 (eight) hours as needed for muscle spasms.  02/18/18   [provider]  methocarbamol (ROBAXIN-750) 750 MG tablet Take 1 tablet (750 mg total) by mouth 4 (four) times daily. 04/07/18   Lorre Nick, MD  ondansetron (ZOFRAN ODT) 4 MG disintegrating tablet Take 1 tablet (4 mg total) by mouth every 4 (four) hours as needed for nausea or  vomiting. Patient not taking: Reported on 03/26/2018 11/21/17   Arby Barrette, MD  SPRINTEC 28 0.25-35 MG-MCG tablet Take 1 tablet by mouth daily. 02/13/18   [provider]  traMADol (ULTRAM) 50 MG tablet Take 50 mg by mouth 4 (four) times daily as needed for moderate pain.  03/05/18   [provider]    Family History Family History  Problem Relation Age of Onset  . Diabetes Father     Social History Social History   Tobacco Use  . Smoking status: Never Smoker  . Smokeless tobacco: Never Used  Substance Use Topics  . Alcohol use: No  . Drug use: No     Allergies   Patient has no known allergies.   Review of Systems Review of Systems  Constitutional: Positive for chills.  HENT: Positive for congestion, ear pain, sore throat and trouble swallowing (painful, but able). Negative for drooling and voice change.   Respiratory: Negative for cough and shortness of breath.   Cardiovascular: Negative for chest pain.  Musculoskeletal: Positive for myalgias (generalized).     Physical Exam Updated Vital Signs BP 130/69 (BP Location: Right Arm)   Pulse (!) 115   Temp 99.4 F (37.4  C) (Oral)   Resp 18   Wt 104.3 kg   LMP 05/31/2018   SpO2 99%   BMI 40.74 kg/m   Physical Exam  Constitutional: She appears well-developed and well-nourished.  Non-toxic appearance. No distress.  HENT:  Head: Normocephalic and atraumatic.  Right Ear: Tympanic membrane is not perforated, not erythematous, not retracted and not bulging.  Left Ear: Tympanic membrane is not perforated, not erythematous, not retracted and not bulging.  Nose: Mucosal edema present.  Mouth/Throat: Oropharyngeal exudate and posterior oropharyngeal erythema present. Tonsils are 2+ on the right. Tonsils are 2+ on the left.  Posterior oropharynx is symmetric appearing. Patient tolerating own secretions without difficulty. No trismus. No drooling. No hot potato voice. No swelling beneath the tongue,  submandibular compartment is soft.   Eyes: Pupils are equal, round, and reactive to light. Conjunctivae are normal. Right eye exhibits no discharge. Left eye exhibits no discharge.  Neck: Normal range of motion. Neck supple. No neck rigidity. No edema and no erythema present.  Cardiovascular: Normal rate and regular rhythm.  No murmur heard. Pulmonary/Chest: Breath sounds normal. No respiratory distress. She has no wheezes. She has no rales.  Abdominal: Soft. She exhibits no distension. There is no tenderness.  Lymphadenopathy:    She has no cervical adenopathy.  Neurological: She is alert.  Skin: Skin is warm and dry. No rash noted.  Psychiatric: She has a normal mood and affect. Her behavior is normal.  Nursing note and vitals reviewed.  ED Treatments / Results  Labs (all labs ordered are listed, but only abnormal results are displayed) Labs Reviewed  GROUP A STREP BY PCR - Abnormal; Notable for the following components:      Result Value   Group A Strep by PCR DETECTED (*)    All other components within normal limits  COMPREHENSIVE METABOLIC PANEL - Abnormal; Notable for the following components:   Potassium 3.4 (*)    Glucose, Bld 100 (*)    Albumin 3.4 (*)    All other components within normal limits  CBC WITH DIFFERENTIAL/PLATELET - Abnormal; Notable for the following components:   WBC 11.3 (*)    Neutro Abs 9.0 (*)    Monocytes Absolute 1.1 (*)    All other components within normal limits  URINALYSIS, ROUTINE W REFLEX MICROSCOPIC  I-STAT CG4 LACTIC ACID, ED  I-STAT BETA HCG BLOOD, ED ([facility], [facility], [facility] ONLY)  I-STAT CG4 LACTIC ACID, ED    EKG None  Radiology Dg Chest 2 View  Result Date: 05/31/2018 CLINICAL DATA:  Fever and sore throat for 2 days EXAM: CHEST - 2 VIEW COMPARISON:  07/18/2016 FINDINGS: The heart size and mediastinal contours are within normal limits. Both lungs are clear. The visualized skeletal structures are unremarkable. IMPRESSION: No active  cardiopulmonary disease. Electronically Signed   By: Alcide Clever M.D.   On: 05/31/2018 10:22    Procedures Procedures (including critical care time)  Medications Ordered in ED Medications  acetaminophen (TYLENOL) tablet 650 mg (650 mg Oral Given 05/31/18 0950)     Initial Impression / Assessment and Plan / ED Course  I have reviewed the triage vital signs and the nursing notes.  Pertinent labs & imaging results that were available during my care of the patient were reviewed by me and considered in my medical decision making (see chart for details).    Presents with complaint of sore throat.  Patient is nontoxic-appearing, initial vitals notable for fever to 101.8 with likely resultant tachycardia, otherwise WNL,  normalized after anti-pyretics and on my exam. Patient's work-up per triage prior to my assessment has been reviewed: Leukocytosis at 11.3 w/ left shift. No anemia. Mild hypokalemia at 3.4. Patient is not pregnant. Her lactic acid is WNL. On exam patient with tonsillar erythema/exudate. Strep test is positive.  Treated in the emergency department with IM Decadron and IM Penicillin.  Exam non concerning for PTA or RPA, there is no trismus, uvular deviation, or hot potato voice. Patient is tolerating his own secretions without difficulty, full ROM of the neck, no hot potato voice, submandibular compartment is soft. Recommended use of Tylenol and Ibuprofen for any continued discomfort or fevers. I discussed results, treatment plan, need for PCP follow-up, and return precautions with the patient. Provided opportunity for questions, patient confirmed understanding and is in agreement with plan.   Vitals:   05/31/18 1223 05/31/18 1238  BP: 124/68 126/76  Pulse: 98 99  Resp: 18 18  Temp:    SpO2: 99% 100%      Final Clinical Impressions(s) / ED Diagnoses   Final diagnoses:  Strep throat    ED Discharge Orders    None       Cherly Anderson, PA-C 05/31/18 1305      Tegeler, Canary Brim, MD 06/01/18 [phone]

## 2018-05-31 NOTE — Discharge Instructions (Addendum)

## 2018-07-09 ENCOUNTER — Other Ambulatory Visit: Payer: Self-pay

## 2018-07-09 ENCOUNTER — Encounter (HOSPITAL_COMMUNITY): Payer: Self-pay | Admitting: Emergency Medicine

## 2018-07-09 ENCOUNTER — Emergency Department (HOSPITAL_COMMUNITY)
Admission: EM | Admit: 2018-07-09 | Discharge: 2018-07-09 | Disposition: A | Discharge status: Home / Self Care | Payer: Self-pay | Attending: Emergency Medicine | Admitting: Emergency Medicine

## 2018-07-09 ENCOUNTER — Emergency Department (HOSPITAL_COMMUNITY): Payer: Self-pay

## 2018-07-09 DIAGNOSIS — Z79899 Other long term (current) drug therapy: Secondary | ICD-10-CM | POA: Insufficient documentation

## 2018-07-09 DIAGNOSIS — R0789 Other chest pain: Secondary | ICD-10-CM | POA: Insufficient documentation

## 2018-07-09 LAB — PREGNANCY, URINE: Preg Test, Ur: NEGATIVE

## 2018-07-09 LAB — CBC WITH DIFFERENTIAL/PLATELET
ABS IMMATURE GRANULOCYTES: 0.01 10*3/uL (ref 0.00–0.07)
BASOS ABS: 0 10*3/uL (ref 0.0–0.1)
Basophils Relative: 1 %
Eosinophils Absolute: 0 10*3/uL (ref 0.0–0.5)
Eosinophils Relative: 1 %
HCT: 38.3 % (ref 36.0–46.0)
Hemoglobin: 11.8 g/dL — ABNORMAL LOW (ref 12.0–15.0)
IMMATURE GRANULOCYTES: 0 %
LYMPHS PCT: 27 %
Lymphs Abs: 1.5 10*3/uL (ref 0.7–4.0)
MCH: 27.8 pg (ref 26.0–34.0)
MCHC: 30.8 g/dL (ref 30.0–36.0)
MCV: 90.1 fL (ref 80.0–100.0)
MONOS PCT: 10 %
Monocytes Absolute: 0.5 10*3/uL (ref 0.1–1.0)
NEUTROS ABS: 3.4 10*3/uL (ref 1.7–7.7)
NEUTROS PCT: 61 %
Platelets: 320 10*3/uL (ref 150–400)
RBC: 4.25 MIL/uL (ref 3.87–5.11)
RDW: 13.1 % (ref 11.5–15.5)
WBC: 5.4 10*3/uL (ref 4.0–10.5)
nRBC: 0 % (ref 0.0–0.2)

## 2018-07-09 LAB — BASIC METABOLIC PANEL
Anion gap: 6 (ref 5–15)
BUN: 9 mg/dL (ref 6–20)
CO2: 25 mmol/L (ref 22–32)
Calcium: 8.6 mg/dL — ABNORMAL LOW (ref 8.9–10.3)
Chloride: 103 mmol/L (ref 98–111)
Creatinine, Ser: 0.76 mg/dL (ref 0.44–1.00)
GFR calc Af Amer: >60 mL/min (ref >60)
GFR calc non Af Amer: >60 mL/min (ref >60)
GLUCOSE: 81 mg/dL (ref 70–99)
POTASSIUM: 3.6 mmol/L (ref 3.5–5.1)
Sodium: 134 mmol/L — ABNORMAL LOW (ref 135–145)

## 2018-07-09 LAB — URINALYSIS, ROUTINE W REFLEX MICROSCOPIC
Bilirubin Urine: NEGATIVE
Glucose, UA: NEGATIVE mg/dL
Hgb urine dipstick: NEGATIVE
KETONES UR: NEGATIVE mg/dL
LEUKOCYTES UA: NEGATIVE
NITRITE: NEGATIVE
PROTEIN: NEGATIVE mg/dL
Specific Gravity, Urine: 1.017 (ref 1.005–1.030)
pH: 7 (ref 5.0–8.0)

## 2018-07-09 LAB — TROPONIN I: Troponin I: <0.03 ng/mL (ref <0.03)

## 2018-07-09 LAB — D-DIMER, QUANTITATIVE: D-Dimer, Quant: <0.27 ug/mL-FEU (ref 0.00–0.50)

## 2018-07-09 MED ORDER — IBUPROFEN 400 MG PO TABS
400.0000 mg | ORAL_TABLET | Freq: Once | ORAL | Status: AC
  Administered 2018-07-09: 400 mg via ORAL
  Filled 2018-07-09: qty 1

## 2018-07-09 MED ORDER — HYDROCODONE-ACETAMINOPHEN 5-325 MG PO TABS
1.0000 | ORAL_TABLET | Freq: Once | ORAL | Status: AC
  Administered 2018-07-09: 1 via ORAL
  Filled 2018-07-09: qty 1

## 2018-07-09 NOTE — ED Notes (Signed)
Before ambulating to the bathroom. Pt reported pain in her pelvis and lower back. Urine culture has been added per EDP.

## 2018-07-09 NOTE — ED Notes (Signed)
ED Provider at bedside. 

## 2018-07-09 NOTE — Discharge Instructions (Signed)
Take over the counter tylenol and ibuprofen, as directed on packaging, as needed for discomfort.  Apply moist heat or ice to the area(s) of discomfort, for 15 minutes at a time, several times per day for the next few days.  Do not fall asleep on a heating or ice pack.  Call your regular medical doctor today to schedule a follow up appointment this week.  Return to the Emergency Department immediately if worsening. ° °

## 2018-07-09 NOTE — ED Triage Notes (Signed)
Pt c/o upper chest burning for a few weeks. Denies radiation or changes in her diet. She is now reporting rectal pressure. Last bm was last night she reports it was hard but normal. Swarm in progress.

## 2018-07-09 NOTE — ED Notes (Signed)
Patient transported to X-ray 

## 2018-07-09 NOTE — ED Provider Notes (Signed)
[facility] EMERGENCY DEPARTMENT Provider Note   CSN: [id number] Arrival date & time: 07/09/18  4098     History   Chief Complaint Chief Complaint  Patient presents with  . Chest Pain    HPI Annette Ferguson is a 22 y.o. female.  HPI  Pt was seen at 0935. Per pt, c/o gradual onset and persistence of constant bilateral upper chest wall "pain" for the past several weeks. Pt describes the pain as "burning" and constant, worsens with palpation of the area. Pt also c/o constipation, with last BM last night "hard." Pt has been taking motrin without improvement of her symptoms. Denies abd pain, no N/V/D, no fevers, no rash, no palpitations, no SOB/cough, no back pain, no calf/LE pain or unilateral swelling, no injury.     Past Medical History:  Diagnosis Date  . Seasonal allergies     There are no active problems to display for this patient.   History reviewed. No pertinent surgical history.   OB History    Gravida  0   Para  0   Term  0   Preterm  0   AB  0   Living  0     SAB  0   TAB  0   Ectopic  0   Multiple  0   Live Births  0            Home Medications    Prior to Admission medications   Medication Sig Start Date End Date Taking? Authorizing Provider  methocarbamol (ROBAXIN) 500 MG tablet Take 1,000 mg by mouth every 8 (eight) hours as needed for muscle spasms.  02/18/18   [provider]  methocarbamol (ROBAXIN-750) 750 MG tablet Take 1 tablet (750 mg total) by mouth 4 (four) times daily. 04/07/18   Lorre Nick, MD  SPRINTEC 28 0.25-35 MG-MCG tablet Take 1 tablet by mouth daily. 02/13/18   [provider]  traMADol (ULTRAM) 50 MG tablet Take 50 mg by mouth 4 (four) times daily as needed for moderate pain.  03/05/18   [provider]    Family History Family History  Problem Relation Age of Onset  . Diabetes Father     Social History Social History   Tobacco Use  . Smoking status: Never  Smoker  . Smokeless tobacco: Never Used  Substance Use Topics  . Alcohol use: No  . Drug use: No     Allergies   Patient has no known allergies.   Review of Systems Review of Systems ROS: Statement: All systems negative except as marked or noted in the HPI; Constitutional: Negative for fever and chills. ; ; Eyes: Negative for eye pain, redness and discharge. ; ; ENMT: Negative for ear pain, hoarseness, nasal congestion, sinus pressure and sore throat. ; ; Cardiovascular: Negative for palpitations, diaphoresis, dyspnea and peripheral edema. ; ; Respiratory: Negative for cough, wheezing and stridor. ; ; Gastrointestinal: +constipation. Negative for nausea, vomiting, diarrhea, abdominal pain, blood in stool, hematemesis, jaundice and rectal bleeding. . ; ; Genitourinary: Negative for dysuria, flank pain and hematuria. ; ; Musculoskeletal: +chest wall pain. Negative for back pain and neck pain. Negative for swelling and trauma.; ; Skin: Negative for pruritus, rash, abrasions, blisters, bruising and skin lesion.; ; Neuro: Negative for headache, lightheadedness and neck stiffness. Negative for weakness, altered level of consciousness, altered mental status, extremity weakness, paresthesias, involuntary movement, seizure and syncope.       Physical Exam Updated Vital Signs BP  120/77 (BP Location: Left Arm)   Pulse 93   Temp 97.9 F (36.6 C) (Oral)   Resp 20   Ht 5\' 3"  (1.6 m)   Wt 99.8 kg   LMP 05/09/2018   SpO2 100%   BMI 38.97 kg/m   Physical Exam 0940: Physical examination:  Nursing notes reviewed; Vital signs and O2 SAT reviewed;  Constitutional: Well developed, Well nourished, Well hydrated, In no acute distress; Head:  Normocephalic, atraumatic; Eyes: EOMI, PERRL, No scleral icterus; ENMT: Mouth and pharynx normal, Mucous membranes moist; Neck: Supple, Full range of motion, No lymphadenopathy; Cardiovascular: Regular rate and rhythm, No gallop; Respiratory: Breath sounds clear &  equal bilaterally, No wheezes.  Speaking full sentences with ease, Normal respiratory effort/excursion; Chest: +R>L upper anterior chest wall tender to palp. No deformity, no rash, no soft tissue crepitus. Movement normal; Abdomen: Soft, Nontender, Nondistended, Normal bowel sounds; Genitourinary: No CVA tenderness; Extremities: Peripheral pulses normal, No tenderness, No edema, No calf edema or asymmetry.; Neuro: AA&Ox3, Major CN grossly intact.  Speech clear. No gross focal motor or sensory deficits in extremities.; Skin: Color normal, Warm, Dry.   ED Treatments / Results  Labs (all labs ordered are listed, but only abnormal results are displayed)   EKG EKG Interpretation  Date/Time:  Monday July 09 2018 09:33:55 EST Ventricular Rate:  99 PR Interval:    QRS Duration: 98 QT Interval:  338 QTC Calculation: 434 R Axis:   80 Text Interpretation:  Sinus rhythm Nonspecific T abnormalities, anterior leads Baseline wander Artifact When compared with ECG of 07/18/2016 No significant change was found Confirmed by Samuel JesterMcManus, Idonna Heeren [phone]) on 07/09/2018 9:42:08 AM   Radiology   Procedures Procedures (including critical care time)  Medications Ordered in ED Medications  HYDROcodone-acetaminophen (NORCO/VICODIN) 5-325 MG per tablet 1 tablet (has no administration in time range)  ibuprofen (ADVIL,MOTRIN) tablet 400 mg (has no administration in time range)     Initial Impression / Assessment and Plan / ED Course  I have reviewed the triage vital signs and the nursing notes.  Pertinent labs & imaging results that were available during my care of the patient were reviewed by me and considered in my medical decision making (see chart for details).  MDM Reviewed: previous chart, nursing note and vitals Reviewed previous: labs and ECG Interpretation: labs, ECG and x-ray   Results for orders placed or performed during the hospital encounter of 07/09/18  Basic metabolic panel  Result  Value Ref Range   Sodium 134 (L) 135 - 145 mmol/L   Potassium 3.6 3.5 - 5.1 mmol/L   Chloride 103 98 - 111 mmol/L   CO2 25 22 - 32 mmol/L   Glucose, Bld 81 70 - 99 mg/dL   BUN 9 6 - 20 mg/dL   Creatinine, Ser 1.910.76 0.44 - 1.00 mg/dL   Calcium 8.6 (L) 8.9 - 10.3 mg/dL   GFR calc non Af Amer >60 >60 mL/min   GFR calc Af Amer >60 >60 mL/min   Anion gap 6 5 - 15  Troponin I - Once  Result Value Ref Range   Troponin I <0.03 <0.03 ng/mL  CBC with Differential  Result Value Ref Range   WBC 5.4 4.0 - 10.5 K/uL   RBC 4.25 3.87 - 5.11 MIL/uL   Hemoglobin 11.8 (L) 12.0 - 15.0 g/dL   HCT 47.838.3 29.536.0 - 62.146.0 %   MCV 90.1 80.0 - 100.0 fL   MCH 27.8 26.0 - 34.0 pg   MCHC 30.8 30.0 -  36.0 g/dL   RDW 16.1 09.6 - 04.5 %   Platelets 320 150 - 400 K/uL   nRBC 0.0 0.0 - 0.2 %   Neutrophils Relative % 61 %   Neutro Abs 3.4 1.7 - 7.7 K/uL   Lymphocytes Relative 27 %   Lymphs Abs 1.5 0.7 - 4.0 K/uL   Monocytes Relative 10 %   Monocytes Absolute 0.5 0.1 - 1.0 K/uL   Eosinophils Relative 1 %   Eosinophils Absolute 0.0 0.0 - 0.5 K/uL   Basophils Relative 1 %   Basophils Absolute 0.0 0.0 - 0.1 K/uL   Immature Granulocytes 0 %   Abs Immature Granulocytes 0.01 0.00 - 0.07 K/uL  D-dimer, quantitative  Result Value Ref Range   D-Dimer, Quant <0.27 0.00 - 0.50 ug/mL-FEU  Pregnancy, urine  Result Value Ref Range   Preg Test, Ur NEGATIVE NEGATIVE  Urinalysis, Routine w reflex microscopic  Result Value Ref Range   Color, Urine YELLOW YELLOW   APPearance CLEAR CLEAR   Specific Gravity, Urine 1.017 1.005 - 1.030   pH 7.0 5.0 - 8.0   Glucose, UA NEGATIVE NEGATIVE mg/dL   Hgb urine dipstick NEGATIVE NEGATIVE   Bilirubin Urine NEGATIVE NEGATIVE   Ketones, ur NEGATIVE NEGATIVE mg/dL   Protein, ur NEGATIVE NEGATIVE mg/dL   Nitrite NEGATIVE NEGATIVE   Leukocytes, UA NEGATIVE NEGATIVE   Dg Abd Acute W/chest Result Date: 07/09/2018 CLINICAL DATA:  Chest pain and constipation EXAM: DG ABDOMEN ACUTE W/ 1V  CHEST COMPARISON:  05/31/2018 FINDINGS: Cardiac shadow is within normal limits. The lungs are clear. No bony abnormality is noted. Scattered large and small bowel gas is noted. No significant retained fecal material is noted. No obstructive changes are seen. No free air is noted. No bony abnormality is seen. IMPRESSION: No acute abnormality noted. Electronically Signed   By: Alcide Clever M.D.   On: 07/09/2018 12:04    1300:  Pt has tol PO well while in the ED without N/V.  No stooling while in the ED.  Has ambulated with steady gait. Abd remains benign, resps easy, VSS.   Doubt PE as cause for symptoms with normal d-dimer and low risk Wells.  Doubt ACS as cause for symptoms with normal troponin and unchanged EKG from previous after 2 weeks of constant atypical symptoms. Tx symptomatically at this time. Dx and testing d/w pt.  Questions answered.  Verb understanding, agreeable to d/c home with outpt f/u.    Final Clinical Impressions(s) / ED Diagnoses   Final diagnoses:  None    ED Discharge Orders    None       Samuel Jester, DO 07/13/18 0732

## 2018-07-09 NOTE — ED Notes (Signed)
Got patient undress on the monitor did ekg shown to Dr McManus patient is resting with call bell in reach 

## 2018-07-10 LAB — URINE CULTURE: CULTURE: NO GROWTH

## 2018-09-06 ENCOUNTER — Encounter (HOSPITAL_COMMUNITY): Payer: Self-pay

## 2018-09-06 ENCOUNTER — Inpatient Hospital Stay (HOSPITAL_COMMUNITY)
Admission: AD | Admit: 2018-09-06 | Discharge: 2018-09-06 | Disposition: A | Discharge status: Home / Self Care | Payer: Medicaid Other | Source: Ambulatory Visit | Attending: Obstetrics and Gynecology | Admitting: Obstetrics and Gynecology

## 2018-09-06 DIAGNOSIS — R102 Pelvic and perineal pain unspecified side: Secondary | ICD-10-CM

## 2018-09-06 DIAGNOSIS — R1030 Lower abdominal pain, unspecified: Secondary | ICD-10-CM

## 2018-09-06 DIAGNOSIS — B3731 Acute candidiasis of vulva and vagina: Secondary | ICD-10-CM

## 2018-09-06 DIAGNOSIS — R1032 Left lower quadrant pain: Secondary | ICD-10-CM

## 2018-09-06 DIAGNOSIS — B373 Candidiasis of vulva and vagina: Secondary | ICD-10-CM | POA: Insufficient documentation

## 2018-09-06 DIAGNOSIS — K6289 Other specified diseases of anus and rectum: Secondary | ICD-10-CM

## 2018-09-06 DIAGNOSIS — Z3202 Encounter for pregnancy test, result negative: Secondary | ICD-10-CM | POA: Insufficient documentation

## 2018-09-06 LAB — URINALYSIS, ROUTINE W REFLEX MICROSCOPIC
BILIRUBIN URINE: NEGATIVE
Glucose, UA: NEGATIVE mg/dL
Ketones, ur: NEGATIVE mg/dL
Leukocytes, UA: NEGATIVE
NITRITE: NEGATIVE
PROTEIN: NEGATIVE mg/dL
SPECIFIC GRAVITY, URINE: 1.015 (ref 1.005–1.030)
pH: 6 (ref 5.0–8.0)

## 2018-09-06 LAB — WET PREP, GENITAL
Clue Cells Wet Prep HPF POC: NONE SEEN
Sperm: NONE SEEN
Trich, Wet Prep: NONE SEEN
Yeast Wet Prep HPF POC: NONE SEEN

## 2018-09-06 LAB — URINALYSIS, MICROSCOPIC (REFLEX)

## 2018-09-06 LAB — POCT PREGNANCY, URINE: PREG TEST UR: NEGATIVE

## 2018-09-06 MED ORDER — LIDOCAINE HCL 2 % EX GEL: 1.0000 "application " | CUTANEOUS | 1 refills | Status: DC | PRN

## 2018-09-06 MED ORDER — FLUCONAZOLE 150 MG PO TABS: ORAL_TABLET | ORAL | 0 refills | Status: DC

## 2018-09-06 NOTE — Discharge Instructions (Signed)
[facility]  ° ° °[facility]       Phone: [phone] ° °[facility] Phone: [phone] ° °[facility] [place]  Phone: [phone] ° °[facility] [place]  Phone: [phone] ° °[facility] [place]  Phone: [phone] ° °[facility]       Phone: [phone] ° °[facility]    Phone: [phone]  ° °[facility]    Phone: [phone] ° °[facility]    Phone: [phone] ° °[facility]    Phone: [phone] ° °[facility]    Phone: [phone] ° °[facility]       Phone: [phone]  ° °[facility]  Phone: [phone] ° °[facility]    Phone: [phone] ° °[facility]   Phone: [phone] ° °[facility]      Phone: [phone] ° °[facility]    Phone: [phone] ° °
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
## 2018-09-06 NOTE — MAU Note (Signed)
PT SAYS  FEELS PRESSURE IN HER VAGINA  AND RECTUM.   WENT  TO  DR IN  SEPT- SAID  ALL TEST NEG. BUT HAS A  RECTAL TEAR.  NOW ITS WORSE,  HAS NOT BEEN  BACK TO DR.  [name]

## 2018-09-07 LAB — GC/CHLAMYDIA PROBE AMP (~~LOC~~) NOT AT ARMC
CHLAMYDIA, DNA PROBE: NEGATIVE
NEISSERIA GONORRHEA: NEGATIVE

## 2018-09-09 LAB — HSV CULTURE AND TYPING

## 2018-12-08 ENCOUNTER — Encounter (HOSPITAL_COMMUNITY): Payer: Self-pay | Admitting: Emergency Medicine

## 2018-12-08 ENCOUNTER — Emergency Department (HOSPITAL_COMMUNITY): Payer: Medicaid Other

## 2018-12-08 ENCOUNTER — Emergency Department (HOSPITAL_COMMUNITY)
Admission: EM | Admit: 2018-12-08 | Discharge: 2018-12-09 | Disposition: A | Discharge status: Home / Self Care | Payer: Medicaid Other | Attending: Emergency Medicine | Admitting: Emergency Medicine

## 2018-12-08 ENCOUNTER — Other Ambulatory Visit: Payer: Self-pay

## 2018-12-08 DIAGNOSIS — R0602 Shortness of breath: Secondary | ICD-10-CM | POA: Insufficient documentation

## 2018-12-08 DIAGNOSIS — Z79899 Other long term (current) drug therapy: Secondary | ICD-10-CM | POA: Insufficient documentation

## 2018-12-08 LAB — BASIC METABOLIC PANEL
Anion gap: 10 (ref 5–15)
BUN: 14 mg/dL (ref 6–20)
CO2: 24 mmol/L (ref 22–32)
Calcium: 9 mg/dL (ref 8.9–10.3)
Chloride: 99 mmol/L (ref 98–111)
Creatinine, Ser: 0.75 mg/dL (ref 0.44–1.00)
GFR calc Af Amer: >60 mL/min (ref >60)
GFR calc non Af Amer: >60 mL/min (ref >60)
Glucose, Bld: 89 mg/dL (ref 70–99)
Potassium: 3.7 mmol/L (ref 3.5–5.1)
Sodium: 133 mmol/L — ABNORMAL LOW (ref 135–145)

## 2018-12-08 LAB — CBC
HCT: 37.7 % (ref 36.0–46.0)
Hemoglobin: 12.3 g/dL (ref 12.0–15.0)
MCH: 28.7 pg (ref 26.0–34.0)
MCHC: 32.6 g/dL (ref 30.0–36.0)
MCV: 88.1 fL (ref 80.0–100.0)
Platelets: 353 10*3/uL (ref 150–400)
RBC: 4.28 MIL/uL (ref 3.87–5.11)
RDW: 12.5 % (ref 11.5–15.5)
WBC: 7.5 10*3/uL (ref 4.0–10.5)
nRBC: 0 % (ref 0.0–0.2)

## 2018-12-08 LAB — D-DIMER, QUANTITATIVE: D-Dimer, Quant: 0.33 ug/mL-FEU (ref 0.00–0.50)

## 2018-12-08 LAB — TROPONIN I: Troponin I: <0.03 ng/mL (ref <0.03)

## 2018-12-08 LAB — I-STAT BETA HCG BLOOD, ED (MC, WL, AP ONLY): I-stat hCG, quantitative: <5 m[IU]/mL (ref <5)

## 2018-12-08 MED ORDER — SODIUM CHLORIDE 0.9% FLUSH: 3.0000 mL | Freq: Once | INTRAVENOUS | Status: DC

## 2018-12-08 NOTE — ED Provider Notes (Signed)
[facility] EMERGENCY DEPARTMENT Provider Note   CSN: [id number] Arrival date & time: 12/08/18  2119    History   Chief Complaint Chief Complaint  Patient presents with  . Shortness of Breath  . Chest Pain    HPI Annette Ferguson is a 23 y.o. female.     Patient presents to the emergency department with a chief complaint of chest pain shortness of breath.  She reports onset yesterday.  She states that her symptoms have been persistent throughout the day today.  She denies any fever, chills, or cough.  Denies any known exposures to anyone 50.  She has been socially isolating she denies any history of asthma.  She states that she used her brother's inhaler yesterday and had some improvement of her symptoms.  She tried again today, and did not have any improvement.  She denies any other medical problems, does not take any medications.  She is not on birth control.  She denies any recent travel.  Denies surgery or immobilization.  She states that occasionally when it gets hot outside her feet swell.  Denies any calf or leg pain.  The history is provided by the patient. No language interpreter was used.    Past Medical History:  Diagnosis Date  . Seasonal allergies     There are no active problems to display for this patient.   History reviewed. No pertinent surgical history.   OB History    Gravida  0   Para  0   Term  0   Preterm  0   AB  0   Living  0     SAB  0   TAB  0   Ectopic  0   Multiple  0   Live Births  0            Home Medications    Prior to Admission medications   Medication Sig Start Date End Date Taking? Authorizing Provider  fluconazole (DIFLUCAN) 150 MG tablet Take one tablet now and one tablet in 3 days. 09/06/18   Leftwich-Kirby, Wilmer Floor, CNM  lidocaine (XYLOCAINE) 2 % jelly Apply 1 application topically as needed. 09/06/18   Leftwich-Kirby, Wilmer Floor, CNM  methocarbamol (ROBAXIN) 500 MG tablet Take 1,000 mg by  mouth every 8 (eight) hours as needed for muscle spasms.  02/18/18   [provider]  methocarbamol (ROBAXIN-750) 750 MG tablet Take 1 tablet (750 mg total) by mouth 4 (four) times daily. 04/07/18   Lorre Nick, MD  SPRINTEC 28 0.25-35 MG-MCG tablet Take 1 tablet by mouth daily. 02/13/18   [provider]    Family History Family History  Problem Relation Age of Onset  . Diabetes Father     Social History Social History   Tobacco Use  . Smoking status: Never Smoker  . Smokeless tobacco: Never Used  Substance Use Topics  . Alcohol use: No  . Drug use: No     Allergies   Patient has no known allergies.   Review of Systems Review of Systems  All other systems reviewed and are negative.    Physical Exam Updated Vital Signs BP (!) 132/95 (BP Location: Left Arm)   Pulse 96   Temp 98.7 F (37.1 C) (Oral)   Ht 5\' 5"  (1.651 m)   Wt 106.3 kg   SpO2 100%   BMI 39.00 kg/m   Physical Exam Vitals signs and nursing note reviewed.  Constitutional:  General: She is not in acute distress.    Appearance: She is well-developed.  HENT:     Head: Normocephalic and atraumatic.     Mouth/Throat:     Comments: No stridor, normal phonation Eyes:     Conjunctiva/sclera: Conjunctivae normal.  Neck:     Musculoskeletal: Neck supple.  Cardiovascular:     Rate and Rhythm: Normal rate and regular rhythm.     Heart sounds: No murmur.  Pulmonary:     Effort: Pulmonary effort is normal. No respiratory distress.     Breath sounds: Normal breath sounds.     Comments: Lung sounds are clear, no wheezing Abdominal:     Palpations: Abdomen is soft.     Tenderness: There is no abdominal tenderness.  Musculoskeletal:     Comments: No leg swelling or pitting edema, there is posterior calf tenderness on the left side  Skin:    General: Skin is warm and dry.  Neurological:     Mental Status: She is alert and oriented to person, place, and time.  Psychiatric:         Mood and Affect: Mood normal.        Behavior: Behavior normal.      ED Treatments / Results  Labs (all labs ordered are listed, but only abnormal results are displayed) Labs Reviewed  BASIC METABOLIC PANEL - Abnormal; Notable for the following components:      Result Value   Sodium 133 (*)    All other components within normal limits  CBC  TROPONIN I  D-DIMER, QUANTITATIVE (NOT AT [facility])  I-STAT BETA HCG BLOOD, ED ([facility], [facility], [facility] ONLY)  I-STAT BETA HCG BLOOD, ED ([facility], [facility], [facility] ONLY)    EKG None  Radiology Dg Chest 2 View  Result Date: 12/08/2018 CLINICAL DATA:  Chest pain, shortness of breath EXAM: CHEST - 2 VIEW COMPARISON:  07/09/2018 FINDINGS: Heart and mediastinal contours are within normal limits. No focal opacities or effusions. No acute bony abnormality. IMPRESSION: No active cardiopulmonary disease. Electronically Signed   By: Charlett NoseKevin  Dover M.D.   On: 12/08/2018 22:02    Procedures Procedures (including critical care time)  Medications Ordered in ED Medications  sodium chloride flush (NS) 0.9 % injection 3 mL (has no administration in time range)     Initial Impression / Assessment and Plan / ED Course  I have reviewed the triage vital signs and the nursing notes.  Pertinent labs & imaging results that were available during my care of the patient were reviewed by me and considered in my medical decision making (see chart for details).        Shortness of breath and chest pain.  O2 saturations normal, she is not tachycardic.  She does have some left calf pain with palpation, will check d-dimer.  Otherwise, believe her to be low risk for emergent process.  Her chest x-ray is clear, I doubt COVID-19, as she is afebrile, and is not having cough.  Laboratory work-up is reassuring.  I-STAT hCG and d-dimer are pending.  hCG and d-dimer are negative, doubt PE.  Recommend close follow-up with PCP.    Final Clinical Impressions(s) / ED Diagnoses   Final diagnoses:   Shortness of breath    ED Discharge Orders    None       Roxy HorsemanBrowning, Karleen Seebeck, PA-C 12/09/18 0013    Dione BoozeGlick, David, MD 12/09/18 Earle [id number]

## 2018-12-08 NOTE — ED Triage Notes (Signed)
Reports CP and sob that started yesterday.  Used "asthma pump" reports it helped some yesterday but not helping today.

## 2018-12-08 NOTE — ED Notes (Signed)
PT states understanding of care given, follow up care. PT ambulated from ED to car with a steady gait.  

## 2018-12-08 NOTE — Discharge Instructions (Addendum)
Your test results are normal.  Many things can cause shortness of breath, but no emergent causes were found.  It is advised that you follow-up with your primary care doctor.  Please discuss the results with your doctor.

## 2018-12-09 NOTE — ED Notes (Signed)
PT states understanding of care given, follow up care. PT ambulated from ED to car with a steady gait.  

## 2018-12-10 ENCOUNTER — Other Ambulatory Visit: Payer: Self-pay

## 2018-12-10 ENCOUNTER — Emergency Department (HOSPITAL_BASED_OUTPATIENT_CLINIC_OR_DEPARTMENT_OTHER)
Admission: EM | Admit: 2018-12-10 | Discharge: 2018-12-10 | Disposition: A | Discharge status: Home / Self Care | Payer: Self-pay | Attending: Emergency Medicine | Admitting: Emergency Medicine

## 2018-12-10 ENCOUNTER — Encounter (HOSPITAL_BASED_OUTPATIENT_CLINIC_OR_DEPARTMENT_OTHER): Payer: Self-pay | Admitting: *Deleted

## 2018-12-10 DIAGNOSIS — R0602 Shortness of breath: Secondary | ICD-10-CM | POA: Insufficient documentation

## 2018-12-10 DIAGNOSIS — Z5321 Procedure and treatment not carried out due to patient leaving prior to being seen by health care provider: Secondary | ICD-10-CM | POA: Insufficient documentation

## 2018-12-10 NOTE — ED Notes (Signed)
Pt called multiple times for room assignment. No answer.

## 2018-12-17 NOTE — ED Provider Notes (Signed)
ED ECG REPORT   Date: 12/17/2018  Rate: 96  Rhythm: normal sinus rhythm  QRS Axis: right  Intervals: normal  ST/T Wave abnormalities: nonspecific T wave changes  Conduction Disutrbances:none  Narrative Interpretation:   Old EKG Reviewed: unchanged  I have personally reviewed the EKG tracing and agree with the computerized printout as noted.    Roxy Horseman, PA-C 12/17/18 0444    Dione Booze, MD 12/22/18 1130

## 2019-01-03 DIAGNOSIS — R0789 Other chest pain: Secondary | ICD-10-CM | POA: Insufficient documentation

## 2019-01-03 DIAGNOSIS — R0602 Shortness of breath: Secondary | ICD-10-CM | POA: Insufficient documentation

## 2019-02-22 ENCOUNTER — Other Ambulatory Visit: Payer: Self-pay

## 2019-02-22 ENCOUNTER — Emergency Department (HOSPITAL_COMMUNITY): Payer: Self-pay

## 2019-02-22 ENCOUNTER — Emergency Department (HOSPITAL_COMMUNITY)
Admission: EM | Admit: 2019-02-22 | Discharge: 2019-02-22 | Disposition: A | Discharge status: Home / Self Care | Payer: Self-pay | Attending: Emergency Medicine | Admitting: Emergency Medicine

## 2019-02-22 ENCOUNTER — Encounter (HOSPITAL_COMMUNITY): Payer: Self-pay | Admitting: Emergency Medicine

## 2019-02-22 DIAGNOSIS — R0789 Other chest pain: Secondary | ICD-10-CM

## 2019-02-22 DIAGNOSIS — K649 Unspecified hemorrhoids: Secondary | ICD-10-CM | POA: Insufficient documentation

## 2019-02-22 DIAGNOSIS — Z79899 Other long term (current) drug therapy: Secondary | ICD-10-CM | POA: Insufficient documentation

## 2019-02-22 DIAGNOSIS — U071 COVID-19: Secondary | ICD-10-CM | POA: Insufficient documentation

## 2019-02-22 MED ORDER — DEXAMETHASONE SODIUM PHOSPHATE 10 MG/ML IJ SOLN
10.0000 mg | Freq: Once | INTRAMUSCULAR | Status: AC
  Administered 2019-02-22: 10 mg via INTRAMUSCULAR
  Filled 2019-02-22: qty 1

## 2019-02-22 MED ORDER — KETOROLAC TROMETHAMINE 30 MG/ML IJ SOLN
30.0000 mg | Freq: Once | INTRAMUSCULAR | Status: AC
  Administered 2019-02-22: 30 mg via INTRAMUSCULAR
  Filled 2019-02-22: qty 1

## 2019-02-22 MED ORDER — PREDNISONE 10 MG (21) PO TBPK: ORAL_TABLET | ORAL | 0 refills | Status: DC

## 2019-02-22 MED ORDER — HYDROCODONE-ACETAMINOPHEN 5-325 MG PO TABS: 1.0000 | ORAL_TABLET | ORAL | 0 refills | Status: DC | PRN

## 2019-02-22 MED ORDER — HYDROCODONE-ACETAMINOPHEN 5-325 MG PO TABS
1.0000 | ORAL_TABLET | Freq: Once | ORAL | Status: AC
  Administered 2019-02-22: 1 via ORAL
  Filled 2019-02-22: qty 1

## 2019-02-22 MED ORDER — HYDROCORTISONE ACETATE 25 MG RE SUPP: 25.0000 mg | Freq: Two times a day (BID) | RECTAL | 0 refills | Status: AC

## 2019-02-22 NOTE — ED Triage Notes (Signed)
Pt here from home with covid symptoms times 121 days got a positive test yesterday , has complaints of chest pain today sats 98 % on room air

## 2019-02-22 NOTE — ED Provider Notes (Signed)
[place] EMERGENCY DEPARTMENT Provider Note   CSN: [id number] Arrival date & time: 02/22/19  1758    History   Chief Complaint No chief complaint on file.   HPI Annette Ferguson is a 23 y.o. female.     Pt presents to the ED today with cp and not feeling well.  She had a covid swab by her doctor on July 9th.  She was called yesterday with a positive result.  The pt said she has had intermittent fevers.  The pt has been taking tylenol and thera flu without improvement in sx.  She said she was afraid to go to sleep tonight without getting checked out.     Past Medical History:  Diagnosis Date  . Seasonal allergies     There are no active problems to display for this patient.   History reviewed. No pertinent surgical history.   OB History    Gravida  0   Para  0   Term  0   Preterm  0   AB  0   Living  0     SAB  0   TAB  0   Ectopic  0   Multiple  0   Live Births  0            Home Medications    Prior to Admission medications   Medication Sig Start Date End Date Taking? Authorizing Provider  fluconazole (DIFLUCAN) 150 MG tablet Take one tablet now and one tablet in 3 days. 09/06/18   Leftwich-Kirby, Kathie Dike, CNM  HYDROcodone-acetaminophen (NORCO/VICODIN) 5-325 MG tablet Take 1 tablet by mouth every 4 (four) hours as needed. 02/22/19   Isla Pence, MD  hydrocortisone (ANUSOL-HC) 25 MG suppository Place 1 suppository (25 mg total) rectally 2 (two) times daily. 02/22/19   Isla Pence, MD  lidocaine (XYLOCAINE) 2 % jelly Apply 1 application topically as needed. 09/06/18   Leftwich-Kirby, Kathie Dike, CNM  methocarbamol (ROBAXIN) 500 MG tablet Take 1,000 mg by mouth every 8 (eight) hours as needed for muscle spasms.  02/18/18   [provider]  methocarbamol (ROBAXIN-750) 750 MG tablet Take 1 tablet (750 mg total) by mouth 4 (four) times daily. 04/07/18   Lacretia Leigh, MD  predniSONE (STERAPRED UNI-PAK 21 TAB) 10 MG (21)  TBPK tablet Take 6 tabs for 2 days, then 5 for 2 days, then 4 for 2 days, then 3 for 2 days, 2 for 2 days, then 1 for 2 days 02/22/19   Isla Pence, MD  SPRINTEC 28 0.25-35 MG-MCG tablet Take 1 tablet by mouth daily. 02/13/18   [provider]    Family History Family History  Problem Relation Age of Onset  . Diabetes Father     Social History Social History   Tobacco Use  . Smoking status: Never Smoker  . Smokeless tobacco: Never Used  Substance Use Topics  . Alcohol use: No  . Drug use: No     Allergies   Patient has no known allergies.   Review of Systems Review of Systems  Constitutional: Positive for fatigue and fever.  Respiratory: Positive for cough.      Physical Exam Updated Vital Signs BP 115/62 (BP Location: Right Arm)   Pulse 73   Temp 98.1 F (36.7 C) (Oral)   Resp 19   SpO2 100%   Physical Exam Vitals signs and nursing note reviewed.  Constitutional:      Appearance: Normal appearance.  HENT:  Head: Normocephalic and atraumatic.     Right Ear: External ear normal.     Left Ear: External ear normal.     Nose: Nose normal.     Mouth/Throat:     Mouth: Mucous membranes are moist.     Pharynx: Oropharynx is clear.  Eyes:     Extraocular Movements: Extraocular movements intact.     Conjunctiva/sclera: Conjunctivae normal.     Pupils: Pupils are equal, round, and reactive to light.  Neck:     Musculoskeletal: Normal range of motion and neck supple.  Cardiovascular:     Rate and Rhythm: Normal rate and regular rhythm.     Pulses: Normal pulses.     Heart sounds: Normal heart sounds.  Pulmonary:     Effort: Pulmonary effort is normal.     Breath sounds: Normal breath sounds.  Abdominal:     General: Abdomen is flat.     Palpations: Abdomen is soft.  Musculoskeletal: Normal range of motion.  Skin:    General: Skin is warm.     Capillary Refill: Capillary refill takes less than 2 seconds.  Neurological:     General: No focal  deficit present.     Mental Status: She is alert and oriented to person, place, and time.  Psychiatric:        Mood and Affect: Mood normal.        Behavior: Behavior normal.        Thought Content: Thought content normal.        Judgment: Judgment normal.      ED Treatments / Results  Labs (all labs ordered are listed, but only abnormal results are displayed) Labs Reviewed - No data to display  EKG None  Radiology Dg Chest Portable 1 View  Result Date: 02/22/2019 CLINICAL DATA:  Chest pain.  COVID-19 positive EXAM: PORTABLE CHEST 1 VIEW COMPARISON:  12/08/2018 FINDINGS: Heart and mediastinal contours are within normal limits. No focal opacities or effusions. No acute bony abnormality. IMPRESSION: No active disease. Electronically Signed   By: Charlett NoseKevin  Dover M.D.   On: 02/22/2019 19:02    Procedures Procedures (including critical care time)  Medications Ordered in ED Medications  dexamethasone (DECADRON) injection 10 mg (10 mg Intramuscular Given 02/22/19 1831)  ketorolac (TORADOL) 30 MG/ML injection 30 mg (30 mg Intramuscular Given 02/22/19 1831)     Initial Impression / Assessment and Plan / ED Course  I have reviewed the triage vital signs and the nursing notes.  Pertinent labs & imaging results that were available during my care of the patient were reviewed by me and considered in my medical decision making (see chart for details).       EKG not coming through on MUSE.  It is NSR HR of 73.  No sig abn.  No st or t wave changes.  CP is likely due to the covid virus.  No PNA  Pt also wanted me to look at her bottom as she felt something "come out" when she had her last bm.  She does have a small hemorrhoid. She will be put on anusol for that.  Pt is feeling better after meds.  She is stable for d/c.  Return if worse.  Annette Ferguson was evaluated in Emergency Department on 02/22/2019 for the symptoms described in the history of present illness. She was evaluated in  the context of the global COVID-19 pandemic, which necessitated consideration that the patient might be at risk for infection with the  SARS-CoV-2 virus that causes COVID-19. Institutional protocols and algorithms that pertain to the evaluation of patients at risk for COVID-19 are in a state of rapid change based on information released by regulatory bodies including the CDC and federal and state organizations. These policies and algorithms were followed during the patient's care in the ED.  Final Clinical Impressions(s) / ED Diagnoses   Final diagnoses:  2019 novel coronavirus disease (COVID-19)  Hemorrhoids, unspecified hemorrhoid type  Atypical chest pain    ED Discharge Orders         Ordered    predniSONE (STERAPRED UNI-PAK 21 TAB) 10 MG (21) TBPK tablet     02/22/19 1932    HYDROcodone-acetaminophen (NORCO/VICODIN) 5-325 MG tablet  Every 4 hours PRN     02/22/19 1932    hydrocortisone (ANUSOL-HC) 25 MG suppository  2 times daily     02/22/19 1932           Jacalyn LefevreHaviland, Kelin Borum, MD 02/22/19 1933

## 2019-02-22 NOTE — ED Notes (Signed)
Pt discharged from ED; instructions provided and scripts given; Pt encouraged to return to ED if symptoms worsen and to f/u with PCP; Pt verbalized understanding of all instructions 

## 2019-02-28 ENCOUNTER — Emergency Department (HOSPITAL_COMMUNITY)
Admission: EM | Admit: 2019-02-28 | Discharge: 2019-02-28 | Disposition: A | Discharge status: Home / Self Care | Payer: Medicaid Other | Attending: Emergency Medicine | Admitting: Emergency Medicine

## 2019-02-28 ENCOUNTER — Emergency Department (HOSPITAL_COMMUNITY): Payer: Medicaid Other

## 2019-02-28 ENCOUNTER — Other Ambulatory Visit: Payer: Self-pay

## 2019-02-28 ENCOUNTER — Encounter (HOSPITAL_COMMUNITY): Payer: Self-pay

## 2019-02-28 DIAGNOSIS — U071 COVID-19: Secondary | ICD-10-CM | POA: Insufficient documentation

## 2019-02-28 DIAGNOSIS — M940 Chondrocostal junction syndrome [Tietze]: Secondary | ICD-10-CM

## 2019-02-28 DIAGNOSIS — Z79899 Other long term (current) drug therapy: Secondary | ICD-10-CM | POA: Insufficient documentation

## 2019-02-28 MED ORDER — KETOROLAC TROMETHAMINE 60 MG/2ML IM SOLN
60.0000 mg | Freq: Once | INTRAMUSCULAR | Status: AC
  Administered 2019-02-28: 60 mg via INTRAMUSCULAR
  Filled 2019-02-28: qty 2

## 2019-02-28 MED ORDER — NAPROXEN 500 MG PO TABS: 500.0000 mg | ORAL_TABLET | Freq: Two times a day (BID) | ORAL | 0 refills | Status: DC

## 2019-02-28 MED ORDER — HYDROCODONE-ACETAMINOPHEN 5-325 MG PO TABS
1.0000 | ORAL_TABLET | Freq: Once | ORAL | Status: AC
  Administered 2019-02-28: 1 via ORAL
  Filled 2019-02-28: qty 1

## 2019-02-28 NOTE — Discharge Instructions (Signed)
We recommend the use of naproxen as prescribed for management of ongoing pain.  You may try over-the-counter medication such as Salonpas with lidocaine for additional symptom management.  Drink plenty of fluids to prevent dehydration.  Follow-up with your primary care doctor to ensure that symptoms resolve.  You may return to the ED for any new or concerning symptoms.

## 2019-02-28 NOTE — ED Triage Notes (Signed)
Recently diagnosed on 7/16 as being covid +. Recent discharge from ED 7/17 with prescriptions of Norco and Prednisone. Since then no relief pt feels symptoms have only progressed. Currently c/o chest pressure/tightness, body aches, and weakness. Afebrile on arrival.

## 2019-02-28 NOTE — ED Provider Notes (Signed)
[facility] Provider Note   CSN: [id number] Arrival date & time: 02/28/19  0202    History   Chief Complaint Chief Complaint  Patient presents with  . Covid +    HPI Annette Sievingyquisha G Lesage is a 23 y.o. female.     23 year old female, diagnosed as being COVID positive on 02/14/2019, presents to the emergency department for persistent chest discomfort.  She describes a pressure and tightness in her central chest with associated body aches and generalized fatigue and malaise.  Symptoms have been persistent since last ED evaluation 6 days ago.  She has been managing her symptoms at home with a course of prednisone.  Was also given a prescription for Norco which she has been using for pain.  Patient taking Tylenol since running out of this pain prescription.  While she has had moments of feeling more flushed, she has not had any documented fevers for at least a week.  Subjectively feels that breathing is more difficult.  She has an inhaler which she has been using at home, as needed, but this gives her palpitations after she uses it.  No new sick contacts.  The history is provided by the patient. No language interpreter was used.    Past Medical History:  Diagnosis Date  . Seasonal allergies     There are no active problems to display for this patient.   History reviewed. No pertinent surgical history.   OB History    Gravida  0   Para  0   Term  0   Preterm  0   AB  0   Living  0     SAB  0   TAB  0   Ectopic  0   Multiple  0   Live Births  0            Home Medications    Prior to Admission medications   Medication Sig Start Date End Date Taking? Authorizing Provider  fluconazole (DIFLUCAN) 150 MG tablet Take one tablet now and one tablet in 3 days. 09/06/18   Leftwich-Kirby, Wilmer FloorLisa A, CNM  hydrocortisone (ANUSOL-HC) 25 MG suppository Place 1 suppository (25 mg total) rectally 2 (two) times daily. 02/22/19   Jacalyn LefevreHaviland,  Julie, MD  lidocaine (XYLOCAINE) 2 % jelly Apply 1 application topically as needed. 09/06/18   Leftwich-Kirby, Wilmer FloorLisa A, CNM  methocarbamol (ROBAXIN) 500 MG tablet Take 1,000 mg by mouth every 8 (eight) hours as needed for muscle spasms.  02/18/18   [provider]  methocarbamol (ROBAXIN-750) 750 MG tablet Take 1 tablet (750 mg total) by mouth 4 (four) times daily. 04/07/18   Lorre NickAllen, Anthony, MD  naproxen (NAPROSYN) 500 MG tablet Take 1 tablet (500 mg total) by mouth 2 (two) times daily with a meal. Take as prescribed for body aches/pains 02/28/19   Antony MaduraHumes, Rocquel Askren, PA-C  SPRINTEC 28 0.25-35 MG-MCG tablet Take 1 tablet by mouth daily. 02/13/18   [provider]    Family History Family History  Problem Relation Age of Onset  . Diabetes Father     Social History Social History   Tobacco Use  . Smoking status: Never Smoker  . Smokeless tobacco: Never Used  Substance Use Topics  . Alcohol use: No  . Drug use: No     Allergies   Patient has no known allergies.   Review of Systems Review of Systems Ten systems reviewed and are negative for acute change, except as noted in the  HPI.    Physical Exam Updated Vital Signs Pulse 60   Temp 98.2 F (36.8 C) (Oral)   Resp 18   SpO2 99%   Physical Exam Vitals signs and nursing note reviewed.  Constitutional:      General: She is not in acute distress.    Appearance: She is well-developed. She is not diaphoretic.     Comments: Nontoxic appearing, obese AA female.  HENT:     Head: Normocephalic and atraumatic.  Eyes:     General: No scleral icterus.    Conjunctiva/sclera: Conjunctivae normal.  Neck:     Musculoskeletal: Normal range of motion.  Cardiovascular:     Rate and Rhythm: Normal rate and regular rhythm.     Pulses: Normal pulses.  Pulmonary:     Effort: Pulmonary effort is normal. No respiratory distress.     Breath sounds: No stridor. No wheezing, rhonchi or rales.     Comments: Lungs grossly clear  bilaterally.  Oxygen saturations 98 to 100% on room air.  Respirations even, unlabored. Musculoskeletal: Normal range of motion.  Skin:    General: Skin is warm and dry.     Coloration: Skin is not pale.     Findings: No erythema or rash.  Neurological:     General: No focal deficit present.     Mental Status: She is alert and oriented to person, place, and time.     Coordination: Coordination normal.     Comments: GCS 15.  Moving all extremities spontaneously.  Psychiatric:        Behavior: Behavior normal.      ED Treatments / Results  Labs (all labs ordered are listed, but only abnormal results are displayed) Labs Reviewed - No data to display   EKG ED ECG REPORT   Date: 02/28/2019  Rate: 57bpm  Rhythm: sinus bradycardia  QRS Axis: normal  Intervals: normal  ST/T Wave abnormalities: normal  Conduction Disutrbances:none  Narrative Interpretation: NSR; no ischemic change.  Old EKG Reviewed: none available  I have personally reviewed the EKG tracing and agree with the computerized printout as noted.   Radiology Dg Chest Port 1 View  Result Date: 02/28/2019 CLINICAL DATA:  Shortness of breath. COVID-19 positive 1 week ago. Chest pressure, body aches, weakness. EXAM: PORTABLE CHEST 1 VIEW COMPARISON:  02/22/2019 FINDINGS: Low lung volumes limit assessment. No focal airspace disease. Upper normal heart size likely accentuated by portable technique and low lung volumes. No pleural effusion or pneumothorax. No acute osseous abnormalities. IMPRESSION: Hypoventilatory chest without acute abnormality. Electronically Signed   By: Keith Rake M.D.   On: 02/28/2019 02:49    Procedures Procedures (including critical care time)  Medications Ordered in ED Medications  ketorolac (TORADOL) injection 60 mg (60 mg Intramuscular Given 02/28/19 0323)  HYDROcodone-acetaminophen (NORCO/VICODIN) 5-325 MG per tablet 1 tablet (1 tablet Oral Given 02/28/19 0323)     Initial Impression  / Assessment and Plan / ED Course  I have reviewed the triage vital signs and the nursing notes.  Pertinent labs & imaging results that were available during my care of the patient were reviewed by me and considered in my medical decision making (see chart for details).        Patient diagnosed COVID positive on 02/14/2019 presents to the ED for persistent chest discomfort and myalgias.  Also complaining of fatigue and malaise.  Her vital signs have been stable and she has no hypoxia.  No signs of respiratory distress and lung sounds are  grossly clear.  Chest x-ray is stable compared to 6 days ago.  Have discussed with the patient that symptoms may take some time to fully resolve.  Recommended continued use of anti-inflammatories for chest pain.  She was given 1 tablet of Norco prior to discharge.  I do not feel the patient warrants a refill of narcotic pain medicine at this time.  She has been encouraged to follow-up with her primary care doctor.  Return precautions discussed and provided. Patient discharged in stable condition with no unaddressed concerns.  Annette Ferguson was evaluated in Emergency Department on 02/28/2019 for the symptoms described in the history of present illness. She was evaluated in the context of the global COVID-19 pandemic, which necessitated consideration that the patient might be at risk for infection with the SARS-CoV-2 virus that causes COVID-19. Institutional protocols and algorithms that pertain to the evaluation of patients at risk for COVID-19 are in a state of rapid change based on information released by regulatory bodies including the CDC and federal and state organizations. These policies and algorithms were followed during the patient's care in the ED.   Final Clinical Impressions(s) / ED Diagnoses   Final diagnoses:  Costochondritis  COVID-19 virus infection    ED Discharge Orders         Ordered    naproxen (NAPROSYN) 500 MG tablet  2 times daily with  meals     02/28/19 0359           Antony MaduraHumes, Dimitriy Carreras, PA-C 02/28/19 0419    Zadie RhineWickline, Donald, MD 02/28/19 [phone]

## 2019-03-01 ENCOUNTER — Other Ambulatory Visit: Payer: Self-pay

## 2019-03-01 ENCOUNTER — Emergency Department (HOSPITAL_COMMUNITY)
Admission: EM | Admit: 2019-03-01 | Discharge: 2019-03-01 | Disposition: A | Discharge status: Home / Self Care | Payer: Medicaid Other | Attending: Emergency Medicine | Admitting: Emergency Medicine

## 2019-03-01 DIAGNOSIS — Z5321 Procedure and treatment not carried out due to patient leaving prior to being seen by health care provider: Secondary | ICD-10-CM | POA: Insufficient documentation

## 2019-03-01 DIAGNOSIS — M791 Myalgia, unspecified site: Secondary | ICD-10-CM | POA: Insufficient documentation

## 2019-03-01 NOTE — ED Triage Notes (Addendum)
Per pt she was discarged 2 days ago with Covid +. On July 9th. Symptoms are abdominal pain, body aches, SOB. No fevers.

## 2019-03-01 NOTE — ED Notes (Signed)
Pt's mother was very rude to the staff outside and demanded pt leave. Pt said she was leaving and going elsewhere. Pt was very stable and all vitals were stable.

## 2019-03-13 ENCOUNTER — Telehealth: Payer: Self-pay | Admitting: Obstetrics & Gynecology

## 2019-03-13 NOTE — Telephone Encounter (Signed)
Called the patient to inform of upcoming appointment. The patient answered no to the covid19 screening questions. Also informed of our new office location and no visitors or children due to covid19 restrictions. The patient verbalized understanding. °

## 2019-03-14 ENCOUNTER — Encounter: Payer: Self-pay | Admitting: Obstetrics & Gynecology

## 2019-03-14 ENCOUNTER — Other Ambulatory Visit (HOSPITAL_COMMUNITY)
Admission: RE | Admit: 2019-03-14 | Discharge: 2019-03-14 | Disposition: A | Discharge status: Home / Self Care | Payer: Medicaid Other | Source: Ambulatory Visit | Attending: Obstetrics & Gynecology | Admitting: Obstetrics & Gynecology

## 2019-03-14 ENCOUNTER — Ambulatory Visit (INDEPENDENT_AMBULATORY_CARE_PROVIDER_SITE_OTHER): Payer: Self-pay | Admitting: Obstetrics & Gynecology

## 2019-03-14 ENCOUNTER — Other Ambulatory Visit: Payer: Self-pay

## 2019-03-14 VITALS — BP 117/67 | HR 85 | Ht 64.0 in | Wt 246.0 lb

## 2019-03-14 DIAGNOSIS — N911 Secondary amenorrhea: Secondary | ICD-10-CM

## 2019-03-14 DIAGNOSIS — Z01419 Encounter for gynecological examination (general) (routine) without abnormal findings: Secondary | ICD-10-CM | POA: Diagnosis not present

## 2019-03-14 MED ORDER — MEDROXYPROGESTERONE ACETATE 10 MG PO TABS: 10.0000 mg | ORAL_TABLET | Freq: Every day | ORAL | 0 refills | Status: AC

## 2019-03-14 NOTE — Patient Instructions (Signed)

## 2019-03-14 NOTE — Progress Notes (Signed)
Patient ID: Annette Ferguson, female   DOB: 10-02-1995, 23 y.o.   MRN: [id number]  Chief Complaint  Patient presents with  . Amenorrhea    HPI Annette Ferguson is a 23 y.o. female.  G0P0000 never sexually active who comes for evaluation of amenorrhea for 6 months, previously irregular menses. Her cycles had been lasting up to 3 months up to now. Problems with constipation and hemorrhoid previously managed by her PCP. HPI  Past Medical History:  Diagnosis Date  . Seasonal allergies     No past surgical history on file.  Family History  Problem Relation Age of Onset  . Diabetes Father     Social History Social History   Tobacco Use  . Smoking status: Never Smoker  . Smokeless tobacco: Never Used  Substance Use Topics  . Alcohol use: No  . Drug use: No    No Known Allergies  Current Outpatient Medications  Medication Sig Dispense Refill  . hydrocortisone (ANUSOL-HC) 25 MG suppository Place 1 suppository (25 mg total) rectally 2 (two) times daily. 12 suppository 0  . Multiple Vitamin (MULTIVITAMIN) capsule Take 1 capsule by mouth daily.    . pantoprazole (PROTONIX) 20 MG tablet Take 20 mg by mouth daily.    . medroxyPROGESTERone (PROVERA) 10 MG tablet Take 1 tablet (10 mg total) by mouth daily. For 10 days 10 tablet 0   No current facility-administered medications for this visit.     Review of Systems Review of Systems  Constitutional: Negative.   Respiratory: Negative.   Gastrointestinal: Positive for abdominal distention, abdominal pain and constipation.  Genitourinary: Positive for menstrual problem. Negative for vaginal bleeding and vaginal discharge.    Blood pressure 117/67, pulse 85, height 5\' 4"  (1.626 m), weight 246 lb (111.6 kg), last menstrual period 08/22/2018.  Physical Exam Physical Exam Constitutional:      Appearance: She is obese.  HENT:     Head: Normocephalic.  Cardiovascular:     Rate and Rhythm: Normal rate.  Pulmonary:     Effort:  Pulmonary effort is normal.  Genitourinary:    General: Normal vulva.     Vagina: No vaginal discharge.     Rectum: Normal.     Comments: Uncomfortable with exam, difficult to expose cervix, pap done, unable to do bimanual but no pelvic mass Neurological:     Mental Status: She is alert.     Data Reviewed Lab results  Assessment Secondary amenorrhea Pap was done upon her request Probable chronic anovualtion  Plan Pelvic US order, abdominal only Provera 10 mg 10 day challenge RTC 6 weeks virtual      Emeterio Reeve 03/14/2019, 9:57 AM

## 2019-03-18 LAB — CYTOLOGY - PAP: Diagnosis: NEGATIVE

## 2019-03-25 ENCOUNTER — Ambulatory Visit (HOSPITAL_COMMUNITY): Admission: RE | Admit: 2019-03-25 | Payer: Self-pay | Source: Ambulatory Visit

## 2019-04-03 ENCOUNTER — Other Ambulatory Visit: Payer: Self-pay

## 2019-04-03 ENCOUNTER — Ambulatory Visit (HOSPITAL_COMMUNITY)
Admission: RE | Admit: 2019-04-03 | Discharge: 2019-04-03 | Disposition: A | Discharge status: Home / Self Care | Payer: Self-pay | Source: Ambulatory Visit | Attending: Obstetrics & Gynecology | Admitting: Obstetrics & Gynecology

## 2019-04-03 DIAGNOSIS — N911 Secondary amenorrhea: Secondary | ICD-10-CM | POA: Insufficient documentation

## 2019-05-07 ENCOUNTER — Telehealth: Payer: Self-pay | Admitting: Obstetrics and Gynecology

## 2019-05-07 DIAGNOSIS — K649 Unspecified hemorrhoids: Secondary | ICD-10-CM | POA: Insufficient documentation

## 2019-05-07 NOTE — Telephone Encounter (Signed)
Spoke with patient about her appointment on 9/30 @ 10:55. Patient instructed that the appointment is a mychart visit. Patient instructed to download the mychart app if not already done so. Patient verbalized she will download the app. Patient instructed that a nurse will be calling her around her appointment time.

## 2019-05-08 ENCOUNTER — Other Ambulatory Visit: Payer: Self-pay

## 2019-05-08 ENCOUNTER — Telehealth (INDEPENDENT_AMBULATORY_CARE_PROVIDER_SITE_OTHER): Payer: Self-pay | Admitting: Obstetrics and Gynecology

## 2019-05-08 DIAGNOSIS — N83201 Unspecified ovarian cyst, right side: Secondary | ICD-10-CM | POA: Insufficient documentation

## 2019-05-08 DIAGNOSIS — N911 Secondary amenorrhea: Secondary | ICD-10-CM

## 2019-05-08 DIAGNOSIS — N912 Amenorrhea, unspecified: Secondary | ICD-10-CM

## 2019-05-08 DIAGNOSIS — R109 Unspecified abdominal pain: Secondary | ICD-10-CM

## 2019-05-08 NOTE — Progress Notes (Signed)
TELEHEALTH VIRTUAL RETURN GYNECOLOGY VISIT ENCOUNTER NOTE  I connected with DARE SPILLMAN on 05/08/19 at 10:55 AM EDT by telephone at home and verified that I am speaking with the correct person using two identifiers.   I discussed the limitations, risks, security and privacy concerns of performing an evaluation and management service by telephone and the availability of in person appointments. I also discussed with the patient that there may be a patient responsible charge related to this service. The patient expressed understanding and agreed to proceed.  Appointment Date: 05/08/2019  Primary Care Provider: [place], [place] for [facility]  Chief Complaint: follow up amenorrhea  History of Present Illness:  Annette Ferguson is a 23 y.o. with above CC. Patient saw Dr. Roselie Awkward on 03/14/2019 for new patient visit for 2ndry amenorrhea; pt states she was also endorsing some lower belly pain and genital area, too. Exam unremarkable and pap negative and he ordered a provera challenge and pelvic u/s. Patient states she had bleeding about a week after finishing the provera, back around 8/19 and had bleeding for two weeks and none after that. She had her u/s in late august that showed a 4cm right ovarian cyst.   Patient still having pain  Review of Systems: as noted in the History of Present Illness.  Medications:  Elyza G. Fugitt had no medications administered during this visit. Current Outpatient Medications  Medication Sig Dispense Refill  . diltiazem (CARDIZEM CD) 240 MG 24 hr capsule Place rectally.    . hydrocortisone (ANUSOL-HC) 25 MG suppository Place 1 suppository (25 mg total) rectally 2 (two) times daily. 12 suppository 0  . Multiple Vitamin (MULTIVITAMIN) capsule Take 1 capsule by mouth daily.    . Vitamin D, Ergocalciferol, (DRISDOL) 1.25 MG (50000 UT) CAPS capsule Take by mouth.    . medroxyPROGESTERone (PROVERA) 10 MG tablet Take 1  tablet (10 mg total) by mouth daily. For 10 days (Patient not taking: Reported on 05/08/2019) 10 tablet 0  . pantoprazole (PROTONIX) 20 MG tablet Take 20 mg by mouth daily.     No current facility-administered medications for this visit.     Allergies: has No Known Allergies.  Physical Exam:  There were no vitals taken for this visit. There is no height or weight on file to calculate BMI.   Assessment: pt stable  Plan:  1. Abdominal discomfort I told her it could be due to the cyst and recommend repeat u/s in a few weeks to see if it resolves or not - US PELVIC COMPLETE WITH TRANSVAGINAL; Future  2. Amenorrhea I don't see a TSH or PRL so this was ordered. She is not interested in pregnancy for a few more years. D/w her re: possible medical options if labs are negative.    I discussed the assessment and treatment plan with the patient. The patient was provided an opportunity to ask questions and all were answered. The patient agreed with the plan and demonstrated an understanding of the instructions.   The patient was advised to call back or seek an in-person evaluation/go to the ED if the symptoms worsen or if the condition fails to improve as anticipated.  I provided 15 minutes of non-face-to-face time during this encounter. The visit was done via a phone visit due to issues with MyChart.    RTC: after u/s and labs for in person visit for final plan.   Durene Romans MD Attending Center for [employer] (Faculty  Practice)

## 2019-05-08 NOTE — Progress Notes (Signed)
I connected with  Annette Ferguson on 05/08/19 at 10:55 AM EDT by telephone and verified that I am speaking with the correct person using two identifiers.   I discussed the limitations, risks, security and privacy concerns of performing an evaluation and management service by telephone and the availability of in person appointments. I also discussed with the patient that there may be a patient responsible charge related to this service. The patient expressed understanding and agreed to proceed.  [place], [place] 05/08/2019  10:53 AM

## 2019-05-17 ENCOUNTER — Emergency Department (HOSPITAL_BASED_OUTPATIENT_CLINIC_OR_DEPARTMENT_OTHER): Payer: No Typology Code available for payment source

## 2019-05-17 ENCOUNTER — Encounter (HOSPITAL_BASED_OUTPATIENT_CLINIC_OR_DEPARTMENT_OTHER): Payer: Self-pay | Admitting: *Deleted

## 2019-05-17 ENCOUNTER — Emergency Department (HOSPITAL_BASED_OUTPATIENT_CLINIC_OR_DEPARTMENT_OTHER)
Admission: EM | Admit: 2019-05-17 | Discharge: 2019-05-17 | Disposition: A | Discharge status: Home / Self Care | Payer: No Typology Code available for payment source | Attending: Emergency Medicine | Admitting: Emergency Medicine

## 2019-05-17 ENCOUNTER — Other Ambulatory Visit: Payer: Self-pay

## 2019-05-17 DIAGNOSIS — R0789 Other chest pain: Secondary | ICD-10-CM | POA: Diagnosis not present

## 2019-05-17 DIAGNOSIS — M542 Cervicalgia: Secondary | ICD-10-CM | POA: Diagnosis present

## 2019-05-17 DIAGNOSIS — M25512 Pain in left shoulder: Secondary | ICD-10-CM | POA: Diagnosis not present

## 2019-05-17 DIAGNOSIS — M25511 Pain in right shoulder: Secondary | ICD-10-CM | POA: Insufficient documentation

## 2019-05-17 LAB — PREGNANCY, URINE: Preg Test, Ur: NEGATIVE

## 2019-05-17 MED ORDER — NAPROXEN 500 MG PO TABS: 500.0000 mg | ORAL_TABLET | Freq: Two times a day (BID) | ORAL | 0 refills | Status: DC

## 2019-05-17 MED ORDER — METHOCARBAMOL 500 MG PO TABS: 500.0000 mg | ORAL_TABLET | Freq: Three times a day (TID) | ORAL | 0 refills | Status: AC | PRN

## 2019-05-17 MED ORDER — IBUPROFEN 400 MG PO TABS
600.0000 mg | ORAL_TABLET | Freq: Once | ORAL | Status: AC
  Administered 2019-05-17: 600 mg via ORAL
  Filled 2019-05-17: qty 1
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
## 2019-05-17 NOTE — ED Notes (Signed)
ED Provider at bedside. 

## 2019-06-13 ENCOUNTER — Other Ambulatory Visit: Payer: Self-pay

## 2019-06-13 ENCOUNTER — Ambulatory Visit (HOSPITAL_COMMUNITY)
Admission: RE | Admit: 2019-06-13 | Discharge: 2019-06-13 | Disposition: A | Discharge status: Home / Self Care | Payer: Self-pay | Source: Ambulatory Visit | Attending: Obstetrics and Gynecology | Admitting: Obstetrics and Gynecology

## 2019-06-13 DIAGNOSIS — R109 Unspecified abdominal pain: Secondary | ICD-10-CM | POA: Insufficient documentation

## 2019-06-22 ENCOUNTER — Emergency Department (HOSPITAL_BASED_OUTPATIENT_CLINIC_OR_DEPARTMENT_OTHER)
Admission: EM | Admit: 2019-06-22 | Discharge: 2019-06-22 | Disposition: A | Discharge status: Home / Self Care | Payer: Self-pay | Attending: Emergency Medicine | Admitting: Emergency Medicine

## 2019-06-22 ENCOUNTER — Encounter (HOSPITAL_BASED_OUTPATIENT_CLINIC_OR_DEPARTMENT_OTHER): Payer: Self-pay | Admitting: Emergency Medicine

## 2019-06-22 ENCOUNTER — Other Ambulatory Visit: Payer: Self-pay

## 2019-06-22 ENCOUNTER — Emergency Department (HOSPITAL_BASED_OUTPATIENT_CLINIC_OR_DEPARTMENT_OTHER): Payer: Self-pay

## 2019-06-22 DIAGNOSIS — Y999 Unspecified external cause status: Secondary | ICD-10-CM | POA: Insufficient documentation

## 2019-06-22 DIAGNOSIS — Y9389 Activity, other specified: Secondary | ICD-10-CM | POA: Insufficient documentation

## 2019-06-22 DIAGNOSIS — Y92008 Other place in unspecified non-institutional (private) residence as the place of occurrence of the external cause: Secondary | ICD-10-CM | POA: Insufficient documentation

## 2019-06-22 DIAGNOSIS — S92352A Displaced fracture of fifth metatarsal bone, left foot, initial encounter for closed fracture: Secondary | ICD-10-CM | POA: Insufficient documentation

## 2019-06-22 DIAGNOSIS — Z79899 Other long term (current) drug therapy: Secondary | ICD-10-CM | POA: Insufficient documentation

## 2019-06-22 DIAGNOSIS — W108XXA Fall (on) (from) other stairs and steps, initial encounter: Secondary | ICD-10-CM | POA: Insufficient documentation

## 2019-06-22 NOTE — Discharge Instructions (Signed)
Please read instructions below. Elevate your foot as much as possible.  Do not bear any weight on your left foot until cleared by the orthopedic specialist to do so. You can take 600mg  of ibuprofen every 6 hours as needed for pain. Schedule an appointment with the orthopedic specialist in 1-2 weeks for repeat x-ray and follow-up on your injury. Return to the ER for new or concerning symptoms.

## 2019-06-22 NOTE — ED Notes (Signed)
Pt verbalized understanding of dc instructions.

## 2019-06-22 NOTE — ED Triage Notes (Signed)
Pt here after falling off steps last night and hearing popping sound on impact in left foot. Swollen and unable to bear much weight.

## 2019-06-22 NOTE — ED Provider Notes (Signed)
[facility] EMERGENCY DEPARTMENT Provider Note   CSN: [id number] Arrival date & time: 06/22/19  0944     History   Chief Complaint Chief Complaint  Patient presents with  . Foot Injury    HPI Annette Ferguson is a 23 y.o. female presenting to the ED with complaint of sudden onset of left foot/ankle pain that occurred yesterday evening after she tripped off of the last step on her porch.  She states she rolled her ankle inward and heard a "pop" noise.  She has been having swelling to her left foot and pain with weightbearing since that time.  She treated her symptoms with ibuprofen last night with moderate relief.  She denies wounds or other injuries.  No previous injuries to left foot.     The history is provided by the patient.    Past Medical History:  Diagnosis Date  . Seasonal allergies     Patient Active Problem List   Diagnosis Date Noted  . Abdominal discomfort 05/08/2019  . Right ovarian cyst 05/08/2019  . Amenorrhea, secondary 03/14/2019    History reviewed. No pertinent surgical history.   OB History    Gravida  0   Para  0   Term  0   Preterm  0   AB  0   Living  0     SAB  0   TAB  0   Ectopic  0   Multiple  0   Live Births  0            Home Medications    Prior to Admission medications   Medication Sig Start Date End Date Taking? Authorizing Provider  hydrocortisone (ANUSOL-HC) 25 MG suppository Place 1 suppository (25 mg total) rectally 2 (two) times daily. 02/22/19   Jacalyn Lefevre, MD  medroxyPROGESTERone (PROVERA) 10 MG tablet Take 1 tablet (10 mg total) by mouth daily. For 10 days Patient not taking: Reported on 05/08/2019 03/14/19   Adam Phenix, MD  methocarbamol (ROBAXIN) 500 MG tablet Take 1 tablet (500 mg total) by mouth every 8 (eight) hours as needed. 05/17/19   Petrucelli, Samantha R, PA-C  Multiple Vitamin (MULTIVITAMIN) capsule Take 1 capsule by mouth daily.    [provider]  naproxen  (NAPROSYN) 500 MG tablet Take 1 tablet (500 mg total) by mouth 2 (two) times daily. 05/17/19   Petrucelli, Samantha R, PA-C  Vitamin D, Ergocalciferol, (DRISDOL) 1.25 MG (50000 UT) CAPS capsule Take by mouth.    [provider]    Family History Family History  Problem Relation Age of Onset  . Diabetes Father     Social History Social History   Tobacco Use  . Smoking status: Never Smoker  . Smokeless tobacco: Never Used  Substance Use Topics  . Alcohol use: No  . Drug use: No     Allergies   Patient has no known allergies.   Review of Systems Review of Systems  Musculoskeletal: Positive for arthralgias and joint swelling.  Skin: Negative for wound.     Physical Exam Updated Vital Signs BP 119/71 (BP Location: Right Arm)   Pulse 79   Temp 98 F (36.7 C) (Oral)   Resp 18   SpO2 100%   Physical Exam Vitals signs and nursing note reviewed.  Constitutional:      General: She is not in acute distress.    Appearance: She is well-developed.  HENT:     Head: Normocephalic and atraumatic.  Eyes:  Conjunctiva/sclera: Conjunctivae normal.  Cardiovascular:     Rate and Rhythm: Normal rate.  Pulmonary:     Effort: Pulmonary effort is normal.  Musculoskeletal:     Comments: Left dorsal foot with generalized swelling, tenderness over the lateral aspect.  No obvious deformity.  No tenderness to lateral or medial malleoli, Achilles is intact.  Some pain with range of motion of the ankle though more with the foot.  No wounds.  Normal pulses and sensation.  Neurological:     Mental Status: She is alert.  Psychiatric:        Mood and Affect: Mood normal.        Behavior: Behavior normal.      ED Treatments / Results  Labs (all labs ordered are listed, but only abnormal results are displayed) Labs Reviewed - No data to display  EKG None  Radiology Dg Ankle Complete Left  Result Date: 06/22/2019 CLINICAL DATA:  Twisted ankle EXAM: LEFT FOOT - COMPLETE  3+ VIEW; LEFT ANKLE COMPLETE - 3+ VIEW COMPARISON:  None. FINDINGS: There is a minimally displaced, unusually oriented oblique fracture through the styloid of the left fifth metatarsal base, which extends towards the left fourth fifth metatarsal articulation. No other fracture or dislocation of the left foot or ankle. Joint spaces are well preserved. Soft tissues are unremarkable. IMPRESSION: 1. There is a minimally displaced, unusually oriented oblique fracture through the styloid of the left fifth metatarsal base, which extends towards the left fourth fifth metatarsal articulation. 2.  No other fracture or dislocation of the left foot or ankle. Electronically Signed   By: Lauralyn PrimesAlex  Bibbey M.D.   On: 06/22/2019 10:52   Dg Foot Complete Left  Result Date: 06/22/2019 CLINICAL DATA:  Twisted ankle EXAM: LEFT FOOT - COMPLETE 3+ VIEW; LEFT ANKLE COMPLETE - 3+ VIEW COMPARISON:  None. FINDINGS: There is a minimally displaced, unusually oriented oblique fracture through the styloid of the left fifth metatarsal base, which extends towards the left fourth fifth metatarsal articulation. No other fracture or dislocation of the left foot or ankle. Joint spaces are well preserved. Soft tissues are unremarkable. IMPRESSION: 1. There is a minimally displaced, unusually oriented oblique fracture through the styloid of the left fifth metatarsal base, which extends towards the left fourth fifth metatarsal articulation. 2.  No other fracture or dislocation of the left foot or ankle. Electronically Signed   By: Lauralyn PrimesAlex  Bibbey M.D.   On: 06/22/2019 10:52    Procedures Procedures (including critical care time)  Medications Ordered in ED Medications - No data to display   Initial Impression / Assessment and Plan / ED Course  I have reviewed the triage vital signs and the nursing notes.  Pertinent labs & imaging results that were available during my care of the patient were reviewed by me and considered in my medical decision  making (see chart for details).        Patient with closed minimally displaced oblique fracture through the styloid of the left fifth metatarsal base after tripping off of the last step of her porch yesterday evening.  Neurovascularly intact.  She is in no distress and states she does not need any pain medication here in the ED.  Will place in posterior short leg splint, provided crutches for nonweightbearing and outpatient orthopedic referral for further management.  Discussed importance of elevation, NSAIDs, and nonweightbearing.  Patient is well-appearing, agreeable plan, safe for discharge.  Discussed results, findings, treatment and follow up. Patient advised of return precautions. Patient  verbalized understanding and agreed with plan.  Final Clinical Impressions(s) / ED Diagnoses   Final diagnoses:  Closed displaced fracture of fifth metatarsal bone of left foot, initial encounter    ED Discharge Orders    None       , [place] N, PA-C 06/22/19 1135    Ezequiel Essex, MD 06/22/19 1554

## 2019-07-01 ENCOUNTER — Other Ambulatory Visit: Payer: Self-pay

## 2019-07-01 ENCOUNTER — Ambulatory Visit (INDEPENDENT_AMBULATORY_CARE_PROVIDER_SITE_OTHER): Payer: Self-pay | Admitting: Obstetrics and Gynecology

## 2019-07-01 ENCOUNTER — Encounter: Payer: Self-pay | Admitting: Obstetrics and Gynecology

## 2019-07-01 VITALS — BP 124/82 | HR 106 | Wt 260.8 lb

## 2019-07-01 DIAGNOSIS — N911 Secondary amenorrhea: Secondary | ICD-10-CM

## 2019-07-01 DIAGNOSIS — R109 Unspecified abdominal pain: Secondary | ICD-10-CM

## 2019-07-01 NOTE — Progress Notes (Signed)
Obstetrics and Gynecology Visit Return Patient Evaluation  Appointment Date: 07/01/2019  Primary Care Provider: [place], [place] for [facility]  Chief Complaint: follow up abdominal pain, amenorrhea  History of Present Illness:  Annette Ferguson is a 23 y.o. with the above CC.   Patient seen by me in late September 2020 for above issues. At that time, I recommend TSH, PRL for the amenorrhea and an u/s to follow up a cyst seen on a prior one since she was having abdominal pain.   U/s showed that the cyst had resolved and the u/s was negative; she hadn't gotten her tsh or prl yet.  Pain persists and it is diffusely across the abdomen (see prior notes) and she hasn't had a period since we last spoke.   ?increase in acne recently.   Review of Systems: as noted in the History of Present Illness.   Patient Active Problem List   Diagnosis Date Noted  . Abdominal discomfort 05/08/2019  . Right ovarian cyst 05/08/2019  . Amenorrhea, secondary 03/14/2019   Medications:  Zori Benbrook. Stohr had no medications administered during this visit. Current Outpatient Medications  Medication Sig Dispense Refill  . hydrocortisone (ANUSOL-HC) 25 MG suppository Place 1 suppository (25 mg total) rectally 2 (two) times daily. (Patient not taking: Reported on 07/01/2019) 12 suppository 0  . methocarbamol (ROBAXIN) 500 MG tablet Take 1 tablet (500 mg total) by mouth every 8 (eight) hours as needed. (Patient not taking: Reported on 07/01/2019) 15 tablet 0  . naproxen (NAPROSYN) 500 MG tablet Take 1 tablet (500 mg total) by mouth 2 (two) times daily. (Patient not taking: Reported on 07/01/2019) 10 tablet 0  . Vitamin D, Ergocalciferol, (DRISDOL) 1.25 MG (50000 UT) CAPS capsule Take by mouth.     No current facility-administered medications for this visit.     Allergies: has No Known Allergies.  Physical Exam:  BP 124/82   Pulse (!) 106   Wt 260 lb 12.8 oz (118.3  kg)   LMP 03/27/2019 (Exact Date)   BMI 44.77 kg/m  Body mass index is 44.77 kg/m. General appearance: Well nourished, well developed female in no acute distress.  Neuro/Psych:  Normal mood and affect.    CLINICAL DATA:  Abdominal discomfort. Follow-up RIGHT ovarian cyst. LMP 03/28/2019  EXAM: ULTRASOUND PELVIS TRANSVAGINAL  TECHNIQUE: Transvaginal ultrasound examination of the pelvis was performed including evaluation of the uterus, ovaries, adnexal regions, and pelvic cul-de-sac.  COMPARISON:  04/03/2019  FINDINGS: Uterus  Measurements: 6.0 x 3.2 x 3.1 centimeters = volume: 30.30 mL. No fibroids or other mass visualized. Mildly anteflexed.  Endometrium  Thickness: 3.9 millimeters.  No focal abnormality visualized.  Right ovary  Measurements: 3.7 x 1.9 x 1.6 centimeters = volume: 5.8 mL. Normal appearance/no adnexal mass.  Left ovary  Measurements: 3.0 x 2.0 x 1.5 centimeters = volume: 4.8 mL. Normal appearance/no adnexal mass.  Other findings: Trace amount of free pelvic fluid is likely physiologic.  IMPRESSION: Interval resolution of RIGHT ovarian cyst.  Normal appearance of both ovaries and uterus.   Electronically Signed   By: Nolon Nations M.D.   On: 06/13/2019 15:59  Assessment: pt stable  Plan:  1. Amenorrhea, secondary Will get tsh, prl and beta hcg; pt states she isn't sexually active. She thinks she may have had a pcos, regular primary care labs done at her pcp. But I told her that her s/s sound suspicious for PCOS. I told her re: issues with PCOS about  non gyn issues (HTN, weight, diabetes, cholesterol, etc) and issues related to GYN (anovulation, endometrial hyperplasia risk and fertility issues). Pt to see about getting labs but I told her that in the interim, assuming her tsh and prl are neg that I recommend doing OCPs since she may want to try and get pregnant next year. She states she was put on OCPs earlier this year by  her PCP and it caused some nausea and made her feel "weird"; she was on them for about a month. She denies any contraindications to OCPs. I told her I recommend lo loestrin if her labs come back and if the abdominal pain gets better or goes away on pills then likely related to GYN issue. If not, then may need referral to GI, etc.   2. Abdominal discomfort See above   RTC: 3-32m follow up  Cornelia Copa MD Attending Center for [facility])

## 2019-07-02 LAB — BETA HCG QUANT (REF LAB): hCG Quant: <1 m[IU]/mL

## 2019-07-29 ENCOUNTER — Emergency Department (HOSPITAL_BASED_OUTPATIENT_CLINIC_OR_DEPARTMENT_OTHER)
Admission: EM | Admit: 2019-07-29 | Discharge: 2019-07-29 | Disposition: A | Discharge status: Home / Self Care | Payer: Self-pay | Attending: Emergency Medicine | Admitting: Emergency Medicine

## 2019-07-29 ENCOUNTER — Other Ambulatory Visit: Payer: Self-pay

## 2019-07-29 ENCOUNTER — Emergency Department (HOSPITAL_BASED_OUTPATIENT_CLINIC_OR_DEPARTMENT_OTHER): Payer: Self-pay

## 2019-07-29 ENCOUNTER — Encounter (HOSPITAL_BASED_OUTPATIENT_CLINIC_OR_DEPARTMENT_OTHER): Payer: Self-pay | Admitting: Emergency Medicine

## 2019-07-29 DIAGNOSIS — Z79899 Other long term (current) drug therapy: Secondary | ICD-10-CM | POA: Insufficient documentation

## 2019-07-29 DIAGNOSIS — S92352G Displaced fracture of fifth metatarsal bone, left foot, subsequent encounter for fracture with delayed healing: Secondary | ICD-10-CM | POA: Insufficient documentation

## 2019-07-29 DIAGNOSIS — X58XXXA Exposure to other specified factors, initial encounter: Secondary | ICD-10-CM | POA: Insufficient documentation

## 2019-07-29 HISTORY — DX: Vitamin D deficiency, unspecified: E55.9

## 2019-07-29 NOTE — ED Provider Notes (Signed)
[facility] EMERGENCY DEPARTMENT Provider Note   CSN: [id number] Arrival date & time: 07/29/19  8413     History Chief Complaint  Patient presents with  . Follow-up    foot pain    Annette Ferguson is a 23 y.o. female.  HPI      23yo female with history of recent fifth metatarsal fracture presents with concern for worsening pain after reinjury this AM. Had previously been diagnosed in ED and followed up with Orthopedics, placed in boot and made WBAT. This AM, was getting out of bed and re-injured her foot by stepping down wrong and rolling it. Pain has been controlled by OTC medications. No other injuries.  Past Medical History:  Diagnosis Date  . Seasonal allergies   . Vitamin D deficiency     Patient Active Problem List   Diagnosis Date Noted  . Abdominal discomfort 05/08/2019  . Right ovarian cyst 05/08/2019  . Amenorrhea, secondary 03/14/2019    No past surgical history on file.   OB History    Gravida  0   Para  0   Term  0   Preterm  0   AB  0   Living  0     SAB  0   TAB  0   Ectopic  0   Multiple  0   Live Births  0           Family History  Problem Relation Age of Onset  . Diabetes Father     Social History   Tobacco Use  . Smoking status: Never Smoker  . Smokeless tobacco: Never Used  Substance Use Topics  . Alcohol use: No  . Drug use: No    Home Medications Prior to Admission medications   Medication Sig Start Date End Date Taking? Authorizing Provider  hydrocortisone (ANUSOL-HC) 25 MG suppository Place 1 suppository (25 mg total) rectally 2 (two) times daily. Patient not taking: Reported on 07/01/2019 02/22/19   Jacalyn Lefevre, MD  medroxyPROGESTERone (PROVERA) 10 MG tablet Take 1 tablet (10 mg total) by mouth daily. For 10 days Patient not taking: Reported on 05/08/2019 03/14/19   Adam Phenix, MD  methocarbamol (ROBAXIN) 500 MG tablet Take 1 tablet (500 mg total) by mouth every 8 (eight) hours as  needed. Patient not taking: Reported on 07/01/2019 05/17/19   Petrucelli, Lelon Mast R, PA-C  naproxen (NAPROSYN) 500 MG tablet Take 1 tablet (500 mg total) by mouth 2 (two) times daily. Patient not taking: Reported on 07/01/2019 05/17/19   Petrucelli, Lelon Mast R, PA-C  Vitamin D, Ergocalciferol, (DRISDOL) 1.25 MG (50000 UT) CAPS capsule Take by mouth.    [provider]    Allergies    Patient has no known allergies.  Review of Systems   Review of Systems  Gastrointestinal: Negative for vomiting.  Musculoskeletal: Positive for arthralgias.  Skin: Negative for wound.  Neurological: Negative for numbness and headaches.    Physical Exam Updated Vital Signs BP 121/76   Pulse 78   Temp 97.8 F (36.6 C) (Oral)   Resp 16   Ht 5\' 5"  (1.651 m)   Wt 113.4 kg   LMP 04/29/2019   SpO2 96%   BMI 41.60 kg/m   Physical Exam Vitals and nursing note reviewed.  Constitutional:      General: She is not in acute distress.    Appearance: Normal appearance. She is not ill-appearing, toxic-appearing or diaphoretic.  HENT:     Head: Normocephalic and atraumatic.  Cardiovascular:     Rate and Rhythm: Normal rate.     Pulses: Normal pulses.  Musculoskeletal:        General: Tenderness (5th metatarsal) present.  Skin:    Capillary Refill: Capillary refill takes less than 2 seconds.     Coloration: Skin is not pale.     Findings: No bruising, erythema or rash.  Neurological:     Mental Status: She is alert.     ED Results / Procedures / Treatments   Labs (all labs ordered are listed, but only abnormal results are displayed) Labs Reviewed - No data to display  EKG None  Radiology DG Foot Complete Left  Result Date: 07/29/2019 CLINICAL DATA:  Pain following rolling injury EXAM: LEFT FOOT - COMPLETE 3+ VIEW COMPARISON:  June 22, 2019 FINDINGS: Frontal, oblique, and lateral views obtained. There is again noted a fracture along the proximal most aspect of the fifth metatarsal  with minimal displacement of fracture fragments. There is a second area of fracture, incomplete, in the proximal fifth metatarsal with alignment essentially anatomic. No other fractures are evident. No dislocation. Joint spaces appear normal. No erosive change. IMPRESSION: There are fractures of the proximal fifth metatarsal. Along the proximal most aspect of the fifth metatarsal, there is slight displacement of fracture fragments. A second area of fracture is incomplete and in anatomic alignment, involving the lateral aspect of the base of the fifth metatarsal. No other fractures are evident. No dislocation. No appreciable arthropathy. Electronically Signed   By: Lowella Grip III M.D.   On: 07/29/2019 09:49    Procedures Procedures (including critical care time)  Medications Ordered in ED Medications - No data to display  ED Course  I have reviewed the triage vital signs and the nursing notes.  Pertinent labs & imaging results that were available during my care of the patient were reviewed by me and considered in my medical decision making (see chart for details).    MDM Rules/Calculators/A&P                      23yo female with history of recent fifth metatarsal fracture presents with concern for worsening pain after reinjury this AM.  NV intact. No other injuries. XR shows prox fifth metatarsal fracture with slight displacement, second area of fracture present. Recommend maintaining camwalker, going back to NWB until follow up with Orthopedics. Pt will take ibuprofen/tylenol for pain. Patient discharged in stable condition with understanding of reasons to return.   Final Clinical Impression(s) / ED Diagnoses Final diagnoses:  Closed displaced fracture of fifth metatarsal bone of left foot with delayed healing, subsequent encounter    Rx / DC Orders ED Discharge Orders    None       Gareth Morgan, MD 07/29/19 2158

## 2019-07-29 NOTE — Discharge Instructions (Signed)
Remain nonweightbearing on your foot until you follow up or discuss the injury further with your Orthopedic physician.

## 2019-07-29 NOTE — ED Triage Notes (Signed)
Pt believes she re-injured her left foot.

## 2019-07-29 NOTE — ED Notes (Signed)
Pt refused crutches, as she already has a pair at home.

## 2019-10-21 DIAGNOSIS — Z8719 Personal history of other diseases of the digestive system: Secondary | ICD-10-CM | POA: Insufficient documentation

## 2019-11-27 ENCOUNTER — Encounter: Payer: Self-pay | Admitting: Obstetrics and Gynecology

## 2019-11-27 ENCOUNTER — Ambulatory Visit: Payer: Medicaid Other | Admitting: Obstetrics and Gynecology

## 2019-11-28 NOTE — Progress Notes (Signed)
Patient did not keep her GYN appointment for 11/27/2019.  Cornelia Copa MD Attending [facility] [employer] [facility])

## 2019-12-28 ENCOUNTER — Encounter (HOSPITAL_BASED_OUTPATIENT_CLINIC_OR_DEPARTMENT_OTHER): Payer: Self-pay | Admitting: Emergency Medicine

## 2019-12-28 ENCOUNTER — Other Ambulatory Visit: Payer: Self-pay

## 2019-12-28 ENCOUNTER — Emergency Department (HOSPITAL_BASED_OUTPATIENT_CLINIC_OR_DEPARTMENT_OTHER)
Admission: EM | Admit: 2019-12-28 | Discharge: 2019-12-28 | Disposition: A | Discharge status: Home / Self Care | Payer: HRSA Program | Attending: Emergency Medicine | Admitting: Emergency Medicine

## 2019-12-28 DIAGNOSIS — R0981 Nasal congestion: Secondary | ICD-10-CM | POA: Insufficient documentation

## 2019-12-28 DIAGNOSIS — J029 Acute pharyngitis, unspecified: Secondary | ICD-10-CM

## 2019-12-28 DIAGNOSIS — Z20822 Contact with and (suspected) exposure to covid-19: Secondary | ICD-10-CM | POA: Diagnosis not present

## 2019-12-28 DIAGNOSIS — Z79899 Other long term (current) drug therapy: Secondary | ICD-10-CM | POA: Diagnosis not present

## 2019-12-28 DIAGNOSIS — R070 Pain in throat: Secondary | ICD-10-CM | POA: Diagnosis present

## 2019-12-28 LAB — GROUP A STREP BY PCR: Group A Strep by PCR: NOT DETECTED

## 2019-12-28 LAB — SARS CORONAVIRUS 2 BY RT PCR (HOSPITAL ORDER, PERFORMED IN ~~LOC~~ HOSPITAL LAB): SARS Coronavirus 2: NEGATIVE

## 2019-12-28 MED ORDER — LORATADINE 10 MG PO TABS: 10.0000 mg | ORAL_TABLET | Freq: Every day | ORAL | 0 refills | Status: AC

## 2019-12-28 MED ORDER — FLUTICASONE PROPIONATE 50 MCG/ACT NA SUSP: 2.0000 | Freq: Every day | NASAL | 0 refills | Status: AC

## 2019-12-28 NOTE — Discharge Instructions (Addendum)
If you were given a prescription, please take the prescription as you were instructed and follow the directions given on the discharge paperwork.   Over the next several days you should rest as much as possible, and drink more fluids than usual. Liquids will help thin and loosen mucus so you can cough it up. Liquids will also help prevent dehydration. Using a cool mist humidifier or a vaporizer to increase air moisture in your home can also make it easier for you to breathe and help decrease your cough.  To help soothe a sore throat gargle with warm salt water.  Make salt water by dissolving  teaspoon salt in 1 cup warm water. You may also use throat lozenges and over the counter sore throat spray.  You were tested for Covid.  The results should be available today.  Please check your MyChart to follow-up on the results. If the result is positive, You should be isolated for at least 7 days since the onset of your symptoms AND >72 hours after symptoms resolution (absence of fever without the use of fever reducing medicaiton and improvement in respiratory symptoms), whichever is longer  Please follow up with your primary care provider within 5-7 days for re-evaluation of your symptoms. If you do not have a primary care provider, information for a healthcare clinic has been provided for you to make arrangements for follow up care. Please return to the emergency department for any persistent fevers, worsening sore throat/hoarse voice, inability to swallow, persistent vomiting, chest pain, shortness of breath, coughing up blood, or any new or worsening symptoms.

## 2019-12-28 NOTE — ED Triage Notes (Signed)
Sore throat x 2 days

## 2019-12-28 NOTE — ED Provider Notes (Signed)
[facility] EMERGENCY DEPARTMENT Provider Note   CSN: [id number] Arrival date & time: 12/28/19  1113     History Chief Complaint  Patient presents with  . Sore Throat    Annette Ferguson is a 24 y.o. female.  HPI   24 year old female with a history of seasonal allergies who presents the emergency department today complaining of URI symptoms.  She states that she has ongoing seasonal allergies however 2 days ago symptoms seem to worsen and she has had worsening rhinorrhea, congestion, sore throat and watery eyes.  She also feels like she has been having some headaches.  She said no fevers at home.  She has had no sick contacts or known Covid exposures.  She was tested for Covid yesterday at [facility] but does not have the results yet and wants to know she can get a faster test.  Past Medical History:  Diagnosis Date  . Seasonal allergies   . Vitamin D deficiency     Patient Active Problem List   Diagnosis Date Noted  . Abdominal discomfort 05/08/2019  . Right ovarian cyst 05/08/2019  . Amenorrhea, secondary 03/14/2019    Past Surgical History:  Procedure Laterality Date  . ANAL FISSURE REPAIR       OB History    Gravida  0   Para  0   Term  0   Preterm  0   AB  0   Living  0     SAB  0   TAB  0   Ectopic  0   Multiple  0   Live Births  0           Family History  Problem Relation Age of Onset  . Diabetes Father     Social History   Tobacco Use  . Smoking status: Never Smoker  . Smokeless tobacco: Never Used  Substance Use Topics  . Alcohol use: No  . Drug use: No    Home Medications Prior to Admission medications   Medication Sig Start Date End Date Taking? Authorizing Provider  fluticasone (FLONASE) 50 MCG/ACT nasal spray Place 2 sprays into both nostrils daily. 12/28/19   Couture, Cortni S, PA-C  hydrocortisone (ANUSOL-HC) 25 MG suppository Place 1 suppository (25 mg total) rectally 2 (two) times daily. Patient not taking:  Reported on 07/01/2019 02/22/19   Isla Pence, MD  loratadine (CLARITIN) 10 MG tablet Take 1 tablet (10 mg total) by mouth daily. 12/28/19 01/27/20  Couture, Cortni S, PA-C  medroxyPROGESTERone (PROVERA) 10 MG tablet Take 1 tablet (10 mg total) by mouth daily. For 10 days Patient not taking: Reported on 05/08/2019 03/14/19   Woodroe Mode, MD  methocarbamol (ROBAXIN) 500 MG tablet Take 1 tablet (500 mg total) by mouth every 8 (eight) hours as needed. Patient not taking: Reported on 07/01/2019 05/17/19   Petrucelli, Aldona Bar R, PA-C  naproxen (NAPROSYN) 500 MG tablet Take 1 tablet (500 mg total) by mouth 2 (two) times daily. Patient not taking: Reported on 07/01/2019 05/17/19   Petrucelli, Aldona Bar R, PA-C  Vitamin D, Ergocalciferol, (DRISDOL) 1.25 MG (50000 UT) CAPS capsule Take by mouth.    [provider]    Allergies    Patient has no known allergies.  Review of Systems   Review of Systems  Constitutional: Negative for fever.  HENT: Positive for congestion, rhinorrhea, sinus pressure, sinus pain, sneezing and sore throat.   Eyes: Positive for discharge (watery). Negative for visual disturbance.  Respiratory: Negative for cough  and shortness of breath.   Cardiovascular: Negative for chest pain.  Gastrointestinal: Negative for abdominal pain.  Genitourinary: Negative for pelvic pain.  Musculoskeletal: Negative for myalgias.  Neurological: Positive for headaches.    Physical Exam Updated Vital Signs BP 122/78 (BP Location: Right Arm)   Pulse 84   Temp 98.8 F (37.1 C) (Oral)   Resp 16   Ht 5\' 3"  (1.6 m)   Wt 113.4 kg   SpO2 99%   BMI 44.29 kg/m   Physical Exam Vitals and nursing note reviewed.  Constitutional:      General: She is not in acute distress.    Appearance: She is well-developed.  HENT:     Head: Normocephalic and atraumatic.     Right Ear: A middle ear effusion is present.     Left Ear: A middle ear effusion is present.     Ears:     Comments:  Bilateral TMs are swollen but nonerythematous.    Mouth/Throat:     Mouth: Mucous membranes are moist.     Pharynx: Uvula midline. Oropharyngeal exudate and posterior oropharyngeal erythema present.     Tonsils: Tonsillar exudate present. No tonsillar abscesses. 2+ on the right. 1+ on the left.  Eyes:     Conjunctiva/sclera: Conjunctivae normal.  Cardiovascular:     Rate and Rhythm: Normal rate.  Pulmonary:     Effort: Pulmonary effort is normal.  Musculoskeletal:        General: Normal range of motion.     Cervical back: Neck supple.  Skin:    General: Skin is warm and dry.  Neurological:     Mental Status: She is alert.     ED Results / Procedures / Treatments   Labs (all labs ordered are listed, but only abnormal results are displayed) Labs Reviewed  GROUP A STREP BY PCR  SARS CORONAVIRUS 2 BY RT PCR (HOSPITAL ORDER, PERFORMED IN [facility])    EKG None  Radiology No results found.  Procedures Procedures (including critical care time)  Medications Ordered in ED Medications - No data to display  ED Course  I have reviewed the triage vital signs and the nursing notes.  Pertinent labs & imaging results that were available during my care of the patient were reviewed by me and considered in my medical decision making (see chart for details).    MDM Rules/Calculators/A&P                      24 year old female with history of seasonal allergies presenting with worsening of her seasonal allergies starting 2 days ago.  Has a Covid test pending that she got yesterday at [facility] but would like a faster Covid test.  We will also test her for strep throat.  Strep test is negative.  No evidence of PTA or deep space infection on exam.  Covid test pending at the time of discharge.  She likely has viral URI and her allergies could be contributing.  Will give Rx for fluticasone and Claritin.  Have advised that if Covid test is positive she will need to quarantine.   Advised on PCP follow-up and return precautions.  She voiced understanding of the plan and reasons to return.  All questions answered.  Patient stable for discharge.  ---  22 was evaluated in Emergency Department on 12/28/2019 for the symptoms described in the history of present illness. She was evaluated in the context of the global COVID-19 pandemic,  which necessitated consideration that the patient might be at risk for infection with the SARS-CoV-2 virus that causes COVID-19. Institutional protocols and algorithms that pertain to the evaluation of patients at risk for COVID-19 are in a state of rapid change based on information released by regulatory bodies including the CDC and federal and state organizations. These policies and algorithms were followed during the patient's care in the ED.   Final Clinical Impression(s) / ED Diagnoses Final diagnoses:  Nasal congestion  Sore throat  Person under investigation for COVID-19    Rx / DC Orders ED Discharge Orders         Ordered    fluticasone (FLONASE) 50 MCG/ACT nasal spray  Daily     12/28/19 1222    loratadine (CLARITIN) 10 MG tablet  Daily     12/28/19 [address], [place], PA-C 12/28/19 1227    Milagros Loll, MD 01/01/20 [phone]

## 2020-01-21 ENCOUNTER — Emergency Department (HOSPITAL_BASED_OUTPATIENT_CLINIC_OR_DEPARTMENT_OTHER)
Admission: EM | Admit: 2020-01-21 | Discharge: 2020-01-21 | Disposition: A | Discharge status: Home / Self Care | Payer: Self-pay | Attending: Emergency Medicine | Admitting: Emergency Medicine

## 2020-01-21 ENCOUNTER — Other Ambulatory Visit: Payer: Self-pay

## 2020-01-21 ENCOUNTER — Encounter (HOSPITAL_BASED_OUTPATIENT_CLINIC_OR_DEPARTMENT_OTHER): Payer: Self-pay | Admitting: Emergency Medicine

## 2020-01-21 ENCOUNTER — Emergency Department (HOSPITAL_BASED_OUTPATIENT_CLINIC_OR_DEPARTMENT_OTHER): Payer: Self-pay

## 2020-01-21 DIAGNOSIS — R079 Chest pain, unspecified: Secondary | ICD-10-CM | POA: Insufficient documentation

## 2020-01-21 DIAGNOSIS — R0602 Shortness of breath: Secondary | ICD-10-CM | POA: Insufficient documentation

## 2020-01-21 DIAGNOSIS — N3090 Cystitis, unspecified without hematuria: Secondary | ICD-10-CM | POA: Insufficient documentation

## 2020-01-21 DIAGNOSIS — N390 Urinary tract infection, site not specified: Secondary | ICD-10-CM

## 2020-01-21 DIAGNOSIS — R110 Nausea: Secondary | ICD-10-CM | POA: Insufficient documentation

## 2020-01-21 DIAGNOSIS — R109 Unspecified abdominal pain: Secondary | ICD-10-CM | POA: Insufficient documentation

## 2020-01-21 DIAGNOSIS — R6883 Chills (without fever): Secondary | ICD-10-CM | POA: Insufficient documentation

## 2020-01-21 DIAGNOSIS — J029 Acute pharyngitis, unspecified: Secondary | ICD-10-CM | POA: Insufficient documentation

## 2020-01-21 LAB — CBC WITH DIFFERENTIAL/PLATELET
Abs Immature Granulocytes: 0.05 10*3/uL (ref 0.00–0.07)
Basophils Absolute: 0 10*3/uL (ref 0.0–0.1)
Basophils Relative: 0 %
Eosinophils Absolute: 0 10*3/uL (ref 0.0–0.5)
Eosinophils Relative: 0 %
HCT: 37.7 % (ref 36.0–46.0)
Hemoglobin: 12.3 g/dL (ref 12.0–15.0)
Immature Granulocytes: 1 %
Lymphocytes Relative: 10 %
Lymphs Abs: 1 10*3/uL (ref 0.7–4.0)
MCH: 28.3 pg (ref 26.0–34.0)
MCHC: 32.6 g/dL (ref 30.0–36.0)
MCV: 86.7 fL (ref 80.0–100.0)
Monocytes Absolute: 0.8 10*3/uL (ref 0.1–1.0)
Monocytes Relative: 8 %
Neutro Abs: 8.2 10*3/uL — ABNORMAL HIGH (ref 1.7–7.7)
Neutrophils Relative %: 81 %
Platelets: 289 10*3/uL (ref 150–400)
RBC: 4.35 MIL/uL (ref 3.87–5.11)
RDW: 13.4 % (ref 11.5–15.5)
WBC: 10 10*3/uL (ref 4.0–10.5)
nRBC: 0 % (ref 0.0–0.2)

## 2020-01-21 LAB — URINALYSIS, ROUTINE W REFLEX MICROSCOPIC
Bilirubin Urine: NEGATIVE
Glucose, UA: NEGATIVE mg/dL
Ketones, ur: NEGATIVE mg/dL
Leukocytes,Ua: NEGATIVE
Nitrite: NEGATIVE
Protein, ur: NEGATIVE mg/dL
Specific Gravity, Urine: 1.02 (ref 1.005–1.030)
pH: 6 (ref 5.0–8.0)

## 2020-01-21 LAB — URINALYSIS, MICROSCOPIC (REFLEX)

## 2020-01-21 LAB — COMPREHENSIVE METABOLIC PANEL
ALT: 27 U/L (ref 0–44)
AST: 19 U/L (ref 15–41)
Albumin: 3.6 g/dL (ref 3.5–5.0)
Alkaline Phosphatase: 43 U/L (ref 38–126)
Anion gap: 10 (ref 5–15)
BUN: 9 mg/dL (ref 6–20)
CO2: 25 mmol/L (ref 22–32)
Calcium: 8.5 mg/dL — ABNORMAL LOW (ref 8.9–10.3)
Chloride: 99 mmol/L (ref 98–111)
Creatinine, Ser: 0.79 mg/dL (ref 0.44–1.00)
GFR calc Af Amer: >60 mL/min (ref >60)
GFR calc non Af Amer: >60 mL/min (ref >60)
Glucose, Bld: 101 mg/dL — ABNORMAL HIGH (ref 70–99)
Potassium: 3.5 mmol/L (ref 3.5–5.1)
Sodium: 134 mmol/L — ABNORMAL LOW (ref 135–145)
Total Bilirubin: 0.6 mg/dL (ref 0.3–1.2)
Total Protein: 7.6 g/dL (ref 6.5–8.1)

## 2020-01-21 LAB — GROUP A STREP BY PCR: Group A Strep by PCR: NOT DETECTED

## 2020-01-21 LAB — MONONUCLEOSIS SCREEN: Mono Screen: NEGATIVE

## 2020-01-21 LAB — PREGNANCY, URINE: Preg Test, Ur: NEGATIVE

## 2020-01-21 MED ORDER — ONDANSETRON HCL 4 MG/2ML IJ SOLN
4.0000 mg | Freq: Once | INTRAMUSCULAR | Status: AC
  Administered 2020-01-21: 4 mg via INTRAVENOUS
  Filled 2020-01-21: qty 2

## 2020-01-21 MED ORDER — CEPHALEXIN 500 MG PO CAPS: 500.0000 mg | ORAL_CAPSULE | Freq: Two times a day (BID) | ORAL | 0 refills | Status: DC

## 2020-01-21 MED ORDER — SODIUM CHLORIDE 0.9 % IV BOLUS
1000.0000 mL | Freq: Once | INTRAVENOUS | Status: AC
  Administered 2020-01-21: 1000 mL via INTRAVENOUS

## 2020-01-21 MED ORDER — ACETAMINOPHEN 500 MG PO TABS
1000.0000 mg | ORAL_TABLET | Freq: Once | ORAL | Status: AC
  Administered 2020-01-21: 1000 mg via ORAL
  Filled 2020-01-21: qty 2

## 2020-01-21 MED ORDER — KETOROLAC TROMETHAMINE 15 MG/ML IJ SOLN
15.0000 mg | Freq: Once | INTRAMUSCULAR | Status: AC
  Administered 2020-01-21: 15 mg via INTRAVENOUS
  Filled 2020-01-21: qty 1

## 2020-01-21 MED ORDER — LIDOCAINE VISCOUS HCL 2 % MT SOLN
15.0000 mL | Freq: Once | OROMUCOSAL | Status: AC
  Administered 2020-01-21: 15 mL via OROMUCOSAL
  Filled 2020-01-21: qty 15

## 2020-01-21 MED FILL — CEPHALEXIN 500 MG CAPSULE: 500 | 5 days supply | Qty: 10 | Fill #0

## 2020-01-21 NOTE — ED Notes (Signed)
Pt ambulatory with steady gait to restroom to provide urine specimen 

## 2020-01-21 NOTE — ED Notes (Signed)
ED Provider at bedside. 

## 2020-01-21 NOTE — ED Triage Notes (Signed)
Pt c/o HA, head and chest congestion, sore throat, chills, abdominal pain and generalized body aches x 2 days. Pt also endorses CP and shob, denies cough. Pt ambulatory to room, NAD

## 2020-01-21 NOTE — ED Provider Notes (Signed)
[facility] EMERGENCY DEPARTMENT Provider Note   CSN: [id number] Arrival date & time: 01/21/20  9833     History Chief Complaint  Patient presents with  . Headache  . Sore Throat    Annette Ferguson is a 24 y.o. female who presents with multiple complaints.  Patient reports yesterday she started to have chills, headache, sore throat, chest pain, shortness of breath, abdominal pain, nausea, body aches.  She went to [place] for her birthday last week but denies any known sick contacts.  Chest pain started last night feels like someone is punching in her in the chest.  It is coming and going.  She states she also had shortness of breath last night and she had the heat on because of chills.  The abdominal pain is in the lower abdomen.  Is constant and nonradiating.  She denies fever, runny nose, cough, vomiting, diarrhea, constipation, vaginal bleeding or discharge, urinary symptoms.  She states she has not had a period in months which is normal for her.  She had Covid last year.   HPI     Past Medical History:  Diagnosis Date  . Seasonal allergies   . Vitamin D deficiency     Patient Active Problem List   Diagnosis Date Noted  . Abdominal discomfort 05/08/2019  . Right ovarian cyst 05/08/2019  . Amenorrhea, secondary 03/14/2019    Past Surgical History:  Procedure Laterality Date  . ANAL FISSURE REPAIR       OB History    Gravida  0   Para  0   Term  0   Preterm  0   AB  0   Living  0     SAB  0   TAB  0   Ectopic  0   Multiple  0   Live Births  0           Family History  Problem Relation Age of Onset  . Diabetes Father     Social History   Tobacco Use  . Smoking status: Never Smoker  . Smokeless tobacco: Never Used  Vaping Use  . Vaping Use: Never used  Substance Use Topics  . Alcohol use: No  . Drug use: No    Home Medications Prior to Admission medications   Medication Sig Start Date End Date Taking? Authorizing Provider    fluticasone (FLONASE) 50 MCG/ACT nasal spray Place 2 sprays into both nostrils daily. 12/28/19   Couture, Cortni S, PA-C  hydrocortisone (ANUSOL-HC) 25 MG suppository Place 1 suppository (25 mg total) rectally 2 (two) times daily. Patient not taking: Reported on 07/01/2019 02/22/19   Jacalyn Lefevre, MD  loratadine (CLARITIN) 10 MG tablet Take 1 tablet (10 mg total) by mouth daily. 12/28/19 01/27/20  Couture, Cortni S, PA-C  medroxyPROGESTERone (PROVERA) 10 MG tablet Take 1 tablet (10 mg total) by mouth daily. For 10 days Patient not taking: Reported on 05/08/2019 03/14/19   Adam Phenix, MD  methocarbamol (ROBAXIN) 500 MG tablet Take 1 tablet (500 mg total) by mouth every 8 (eight) hours as needed. Patient not taking: Reported on 07/01/2019 05/17/19   Petrucelli, Lelon Mast R, PA-C  naproxen (NAPROSYN) 500 MG tablet Take 1 tablet (500 mg total) by mouth 2 (two) times daily. Patient not taking: Reported on 07/01/2019 05/17/19   Petrucelli, Lelon Mast R, PA-C  Vitamin D, Ergocalciferol, (DRISDOL) 1.25 MG (50000 UT) CAPS capsule Take by mouth.    [provider]    Allergies  Patient has no known allergies.  Review of Systems   Review of Systems  Constitutional: Positive for chills. Negative for fever.  HENT: Positive for sore throat.   Respiratory: Positive for shortness of breath. Negative for cough.   Cardiovascular: Positive for chest pain.  Gastrointestinal: Positive for abdominal pain and nausea. Negative for diarrhea and vomiting.  Genitourinary: Positive for difficulty urinating. Negative for dysuria, flank pain, pelvic pain and vaginal bleeding.  All other systems reviewed and are negative.   Physical Exam Updated Vital Signs BP 123/70 (BP Location: Right Arm)   Pulse (!) 112   Temp 99.7 F (37.6 C) (Oral)   Resp 15   Ht 5\' 3"  (1.6 m)   Wt 113.4 kg   LMP  (LMP Unknown)   SpO2 99%   BMI 44.29 kg/m   Physical Exam Vitals and nursing note reviewed.  Constitutional:       General: She is not in acute distress.    Appearance: She is well-developed. She is obese. She is not ill-appearing.  HENT:     Head: Normocephalic and atraumatic.     Right Ear: Tympanic membrane normal.     Left Ear: Tympanic membrane normal.     Nose: Nose normal.     Mouth/Throat:     Lips: Pink.     Mouth: Mucous membranes are moist.     Pharynx: No oropharyngeal exudate.     Tonsils: Tonsillar exudate present. No tonsillar abscesses.  Eyes:     General: No scleral icterus.       Right eye: No discharge.        Left eye: No discharge.     Conjunctiva/sclera: Conjunctivae normal.     Pupils: Pupils are equal, round, and reactive to light.  Cardiovascular:     Rate and Rhythm: Normal rate and regular rhythm.  Pulmonary:     Effort: Pulmonary effort is normal. No respiratory distress.     Breath sounds: Normal breath sounds.  Abdominal:     General: There is no distension.     Palpations: Abdomen is soft.     Tenderness: There is abdominal tenderness (suprapubic).  Genitourinary:    Comments: Pelvic: pt declined Musculoskeletal:     Cervical back: Normal range of motion.  Skin:    General: Skin is warm and dry.  Neurological:     Mental Status: She is alert and oriented to person, place, and time.  Psychiatric:        Behavior: Behavior normal.     ED Results / Procedures / Treatments   Labs (all labs ordered are listed, but only abnormal results are displayed) Labs Reviewed  COMPREHENSIVE METABOLIC PANEL - Abnormal; Notable for the following components:      Result Value   Sodium 134 (*)    Glucose, Bld 101 (*)    Calcium 8.5 (*)    All other components within normal limits  CBC WITH DIFFERENTIAL/PLATELET - Abnormal; Notable for the following components:   Neutro Abs 8.2 (*)    All other components within normal limits  URINALYSIS, ROUTINE W REFLEX MICROSCOPIC - Abnormal; Notable for the following components:   Hgb urine dipstick TRACE (*)    All other  components within normal limits  URINALYSIS, MICROSCOPIC (REFLEX) - Abnormal; Notable for the following components:   Bacteria, UA MANY (*)    All other components within normal limits  GROUP A STREP BY PCR  URINE CULTURE  PREGNANCY, URINE  MONONUCLEOSIS SCREEN  EKG EKG Interpretation  Date/Time:  Tuesday January 21 2020 09:59:23 EDT Ventricular Rate:  98 PR Interval:    QRS Duration: 87 QT Interval:  377 QTC Calculation: 482 R Axis:   86 Text Interpretation: Sinus rhythm Nonspecific T wave abnormality Confirmed by Lajean Saver [phone]) on 01/21/2020 10:04:18 AM   Radiology DG Chest 2 View  Result Date: 01/21/2020 CLINICAL DATA:  Chest pain.  Shortness of breath. EXAM: CHEST - 2 VIEW COMPARISON:  05/17/2019. FINDINGS: Mediastinum and hilar structures normal. Lungs are clear. No pleural effusion or pneumothorax. IMPRESSION: No acute cardiopulmonary disease. Electronically Signed   By: Marcello Moores  Register   On: 01/21/2020 09:57    Procedures Procedures (including critical care time)  Medications Ordered in ED Medications  sodium chloride 0.9 % bolus 1,000 mL ( Intravenous Stopped 01/21/20 1110)  ondansetron (ZOFRAN) injection 4 mg (4 mg Intravenous Given 01/21/20 1008)  ketorolac (TORADOL) 15 MG/ML injection 15 mg (15 mg Intravenous Given 01/21/20 1008)  acetaminophen (TYLENOL) tablet 1,000 mg (1,000 mg Oral Given 01/21/20 1121)  lidocaine (XYLOCAINE) 2 % viscous mouth solution 15 mL (15 mLs Mouth/Throat Given 01/21/20 1240)    ED Course  I have reviewed the triage vital signs and the nursing notes.  Pertinent labs & imaging results that were available during my care of the patient were reviewed by me and considered in my medical decision making (see chart for details).  24 year old female presents with multiple complaints.  Her temperature is elevated here but otherwise vital signs are reassuring.  She is tonsillar exudates on exam.  Heart is regular rate and rhythm.  Lungs are  clear to auscultation.  Abdomen is soft and she has tenderness in the suprapubic area.  Will obtain labs, urine, strep.  She is also complaining of chest pain and shortness of breath.  We will add on EKG and chest x-ray.  Will give fluids and Toradol  Blood work is overall reassuring.  Strep and mono are negative.  UA has many bacteria with small leukocytes.  We will send off a urine culture and treat for UTI.  She is feeling better on reassessment.  He is given prescription for Keflex and advised to rest, hydrate take Tylenol ibuprofen and return if worsening.  MDM Rules/Calculators/A&P                          Final Clinical Impression(s) / ED Diagnoses Final diagnoses:  Pharyngitis, unspecified etiology  Urinary tract infection without hematuria, site unspecified    Rx / DC Orders ED Discharge Orders    None       Recardo Evangelist, PA-C 01/21/20 1516    Lajean Saver, MD 01/21/20 1529

## 2020-01-21 NOTE — Discharge Instructions (Signed)
Take Keflex twice a day for 5 days Drink plenty of fluids and rest Take Tylenol or Ibuprofen for pain, fever or chills Please return if you are worsening

## 2020-01-22 LAB — URINE CULTURE
Culture: <10000 — AB
Special Requests: NORMAL

## 2020-02-06 ENCOUNTER — Telehealth: Payer: Self-pay | Admitting: Obstetrics and Gynecology

## 2020-02-06 NOTE — Telephone Encounter (Signed)
Received a call from the patient needing to make an appointment with Dr Vergie Living for a follow up. She asked if this was the office in the white box. I informed her that it was not, but I could schedule her with Dr Vergie Living. She hung up the phone.

## 2020-03-09 ENCOUNTER — Encounter (HOSPITAL_BASED_OUTPATIENT_CLINIC_OR_DEPARTMENT_OTHER): Payer: Self-pay | Admitting: Emergency Medicine

## 2020-03-09 ENCOUNTER — Encounter (HOSPITAL_COMMUNITY): Payer: Self-pay

## 2020-03-09 ENCOUNTER — Emergency Department (HOSPITAL_COMMUNITY)
Admission: EM | Admit: 2020-03-09 | Discharge: 2020-03-10 | Disposition: A | Discharge status: Home / Self Care | Payer: Self-pay | Attending: Emergency Medicine | Admitting: Emergency Medicine

## 2020-03-09 ENCOUNTER — Emergency Department (HOSPITAL_COMMUNITY): Payer: Self-pay

## 2020-03-09 ENCOUNTER — Other Ambulatory Visit: Payer: Self-pay

## 2020-03-09 DIAGNOSIS — R519 Headache, unspecified: Secondary | ICD-10-CM | POA: Insufficient documentation

## 2020-03-09 DIAGNOSIS — R0602 Shortness of breath: Secondary | ICD-10-CM | POA: Insufficient documentation

## 2020-03-09 DIAGNOSIS — Z5321 Procedure and treatment not carried out due to patient leaving prior to being seen by health care provider: Secondary | ICD-10-CM | POA: Insufficient documentation

## 2020-03-09 DIAGNOSIS — R079 Chest pain, unspecified: Secondary | ICD-10-CM | POA: Insufficient documentation

## 2020-03-09 DIAGNOSIS — R0789 Other chest pain: Secondary | ICD-10-CM | POA: Insufficient documentation

## 2020-03-09 LAB — BASIC METABOLIC PANEL
Anion gap: 11 (ref 5–15)
BUN: 8 mg/dL (ref 6–20)
CO2: 27 mmol/L (ref 22–32)
Calcium: 9.5 mg/dL (ref 8.9–10.3)
Chloride: 102 mmol/L (ref 98–111)
Creatinine, Ser: 0.64 mg/dL (ref 0.44–1.00)
GFR calc Af Amer: >60 mL/min (ref >60)
GFR calc non Af Amer: >60 mL/min (ref >60)
Glucose, Bld: 91 mg/dL (ref 70–99)
Potassium: 3.9 mmol/L (ref 3.5–5.1)
Sodium: 140 mmol/L (ref 135–145)

## 2020-03-09 LAB — CBC
HCT: 39.9 % (ref 36.0–46.0)
Hemoglobin: 12.8 g/dL (ref 12.0–15.0)
MCH: 28.7 pg (ref 26.0–34.0)
MCHC: 32.1 g/dL (ref 30.0–36.0)
MCV: 89.5 fL (ref 80.0–100.0)
Platelets: 354 10*3/uL (ref 150–400)
RBC: 4.46 MIL/uL (ref 3.87–5.11)
RDW: 13.6 % (ref 11.5–15.5)
WBC: 6.1 10*3/uL (ref 4.0–10.5)
nRBC: 0 % (ref 0.0–0.2)

## 2020-03-09 LAB — I-STAT BETA HCG BLOOD, ED (NOT ORDERABLE): I-stat hCG, quantitative: <5 m[IU]/mL (ref <5)

## 2020-03-09 LAB — TROPONIN I (HIGH SENSITIVITY): Troponin I (High Sensitivity): <2 ng/L (ref <18)

## 2020-03-09 MED ORDER — SODIUM CHLORIDE 0.9% FLUSH: 3.0000 mL | Freq: Once | INTRAVENOUS | Status: DC

## 2020-03-09 NOTE — ED Triage Notes (Signed)
Arrived POV from home. Patient reports worsening chest pain and SHOB since receiving 1st COVID vaccine on Wednesday 07/28. Patient also reports headache since receiving vaccine. NAD

## 2020-03-09 NOTE — ED Triage Notes (Signed)
Cough/chest pain for 2 weeks, states increased since 7/28 after first covid vaccine. Pt left [facility] ED pta. NAD, amb. Alert.

## 2020-03-09 NOTE — ED Notes (Signed)
PT Urine specimen placed in triage urine bin.

## 2020-03-10 ENCOUNTER — Emergency Department (HOSPITAL_BASED_OUTPATIENT_CLINIC_OR_DEPARTMENT_OTHER)
Admission: EM | Admit: 2020-03-10 | Discharge: 2020-03-10 | Disposition: A | Discharge status: Home / Self Care | Payer: Medicaid Other | Attending: Emergency Medicine | Admitting: Emergency Medicine

## 2020-03-10 DIAGNOSIS — R0789 Other chest pain: Secondary | ICD-10-CM

## 2020-03-10 NOTE — Discharge Instructions (Signed)
Take ibuprofen 600 mg every 6 hours as needed for pain.  Return to the emergency department if symptoms significantly worsen or change.

## 2020-03-10 NOTE — ED Provider Notes (Signed)
[facility] EMERGENCY DEPARTMENT Provider Note   CSN: [id number] Arrival date & time: 03/09/20  2129     History Chief Complaint  Patient presents with  . Chest Pain  . Shortness of Breath    Annette Ferguson is a 24 y.o. female.  Patient is a 24 year old female with no significant past medical history.  She presents today for evaluation of chest discomfort.  Patient states that this has been present intermittently for the past 2 weeks, however worse after receiving her first Covid vaccine several days ago.  She denies any fevers or chills.  She denies any productive cough.  She does feel some shortness of breath especially when she attempts to sleep at night.  Patient with no cardiac risk factors.  The history is provided by the patient.  Chest Pain Pain location:  Substernal area Pain quality: tightness   Pain radiates to:  Does not radiate Pain severity:  Moderate Onset quality:  Gradual Duration:  2 weeks Timing:  Constant Associated symptoms: shortness of breath   Shortness of Breath Associated symptoms: chest pain        Past Medical History:  Diagnosis Date  . Seasonal allergies   . Vitamin D deficiency     Patient Active Problem List   Diagnosis Date Noted  . Abdominal discomfort 05/08/2019  . Right ovarian cyst 05/08/2019  . Amenorrhea, secondary 03/14/2019    Past Surgical History:  Procedure Laterality Date  . ANAL FISSURE REPAIR       OB History    Gravida  0   Para  0   Term  0   Preterm  0   AB  0   Living  0     SAB  0   TAB  0   Ectopic  0   Multiple  0   Live Births  0           Family History  Problem Relation Age of Onset  . Diabetes Father     Social History   Tobacco Use  . Smoking status: Never Smoker  . Smokeless tobacco: Never Used  Vaping Use  . Vaping Use: Never used  Substance Use Topics  . Alcohol use: No  . Drug use: No    Home Medications Prior to Admission medications     Medication Sig Start Date End Date Taking? Authorizing Provider  cephALEXin (KEFLEX) 500 MG capsule Take 1 capsule (500 mg total) by mouth 2 (two) times daily. 01/21/20   Bethel Born, PA-C  fluticasone (FLONASE) 50 MCG/ACT nasal spray Place 2 sprays into both nostrils daily. 12/28/19   Couture, Cortni S, PA-C  hydrocortisone (ANUSOL-HC) 25 MG suppository Place 1 suppository (25 mg total) rectally 2 (two) times daily. Patient not taking: Reported on 07/01/2019 02/22/19   Jacalyn Lefevre, MD  loratadine (CLARITIN) 10 MG tablet Take 1 tablet (10 mg total) by mouth daily. 12/28/19 01/27/20  Couture, Cortni S, PA-C  medroxyPROGESTERone (PROVERA) 10 MG tablet Take 1 tablet (10 mg total) by mouth daily. For 10 days Patient not taking: Reported on 05/08/2019 03/14/19   Adam Phenix, MD  methocarbamol (ROBAXIN) 500 MG tablet Take 1 tablet (500 mg total) by mouth every 8 (eight) hours as needed. Patient not taking: Reported on 07/01/2019 05/17/19   Petrucelli, Lelon Mast R, PA-C  naproxen (NAPROSYN) 500 MG tablet Take 1 tablet (500 mg total) by mouth 2 (two) times daily. Patient not taking: Reported on 07/01/2019 05/17/19   Petrucelli,  Samantha R, PA-C  Vitamin D, Ergocalciferol, (DRISDOL) 1.25 MG (50000 UT) CAPS capsule Take by mouth.    [provider]    Allergies    Patient has no known allergies.  Review of Systems   Review of Systems  Respiratory: Positive for shortness of breath.   Cardiovascular: Positive for chest pain.  All other systems reviewed and are negative.   Physical Exam Updated Vital Signs BP (!) 105/50 (BP Location: Right Arm)   Pulse 66   Temp 98 F (36.7 C) (Oral)   Resp 14   Ht 5\' 4"  (1.626 m)   Wt 115.2 kg   LMP  (Approximate)   SpO2 100%   BMI 43.58 kg/m   Physical Exam Vitals and nursing note reviewed.  Constitutional:      General: She is not in acute distress.    Appearance: She is well-developed. She is not diaphoretic.  HENT:     Head:  Normocephalic and atraumatic.  Cardiovascular:     Rate and Rhythm: Normal rate and regular rhythm.     Heart sounds: No murmur heard.  No friction rub. No gallop.   Pulmonary:     Effort: Pulmonary effort is normal. No respiratory distress.     Breath sounds: Normal breath sounds. No wheezing.  Abdominal:     General: Bowel sounds are normal. There is no distension.     Palpations: Abdomen is soft.     Tenderness: There is no abdominal tenderness.  Musculoskeletal:        General: Normal range of motion.     Cervical back: Normal range of motion and neck supple.     Right lower leg: No tenderness. No edema.     Left lower leg: No tenderness. No edema.  Skin:    General: Skin is warm and dry.  Neurological:     Mental Status: She is alert and oriented to person, place, and time.     ED Results / Procedures / Treatments   Labs (all labs ordered are listed, but only abnormal results are displayed) Labs Reviewed - No data to display  EKG ED ECG REPORT   Date: 03/10/2020  Rate: 80  Rhythm: normal sinus rhythm  QRS Axis: right  Intervals: normal  ST/T Wave abnormalities: nonspecific T wave changes  Conduction Disutrbances:none  Narrative Interpretation:   Old EKG Reviewed: unchanged  I have personally reviewed the EKG tracing and agree with the computerized printout as noted.   Radiology DG Chest 2 View  Result Date: 03/09/2020 CLINICAL DATA:  Chest pain x2 weeks. EXAM: CHEST - 2 VIEW COMPARISON:  January 21, 2020 FINDINGS: The heart size and mediastinal contours are within normal limits. Both lungs are clear. The visualized skeletal structures are unremarkable. IMPRESSION: No active cardiopulmonary disease. Electronically Signed   By: January 23, 2020 M.D.   On: 03/09/2020 17:51    Procedures Procedures (including critical care time)  Medications Ordered in ED Medications - No data to display  ED Course  I have reviewed the triage vital signs and the nursing  notes.  Pertinent labs & imaging results that were available during my care of the patient were reviewed by me and considered in my medical decision making (see chart for details).    MDM Rules/Calculators/A&P  Patient presenting here with complaints of chest discomfort as described in the HPI.  I highly doubt a cardiac etiology.  Patient had laboratory studies done at [facility] long earlier this evening.  She left there  and came here due to prolonged wait time.  Her laboratory studies are unremarkable and EKG shows no change from June 2015.  Patient to be discharged with ibuprofen rest and as needed return.  Final Clinical Impression(s) / ED Diagnoses Final diagnoses:  None    Rx / DC Orders ED Discharge Orders    None       Geoffery Lyons, MD 03/10/20 [id number]

## 2020-05-06 ENCOUNTER — Other Ambulatory Visit: Payer: Self-pay

## 2020-05-06 ENCOUNTER — Encounter (HOSPITAL_BASED_OUTPATIENT_CLINIC_OR_DEPARTMENT_OTHER): Payer: Self-pay | Admitting: Emergency Medicine

## 2020-05-06 ENCOUNTER — Emergency Department (HOSPITAL_BASED_OUTPATIENT_CLINIC_OR_DEPARTMENT_OTHER)
Admission: EM | Admit: 2020-05-06 | Discharge: 2020-05-06 | Disposition: A | Discharge status: Home / Self Care | Payer: Medicaid Other | Attending: Emergency Medicine | Admitting: Emergency Medicine

## 2020-05-06 DIAGNOSIS — R110 Nausea: Secondary | ICD-10-CM | POA: Insufficient documentation

## 2020-05-06 DIAGNOSIS — J029 Acute pharyngitis, unspecified: Secondary | ICD-10-CM | POA: Insufficient documentation

## 2020-05-06 DIAGNOSIS — Z5321 Procedure and treatment not carried out due to patient leaving prior to being seen by health care provider: Secondary | ICD-10-CM | POA: Insufficient documentation

## 2020-05-06 HISTORY — DX: Irregular menstruation, unspecified: N92.6

## 2020-05-06 NOTE — ED Triage Notes (Signed)
Sore throat, headache, nausea, body aches x 2 days.  Unknown covid exposure, + vaccination for covid.  Pt states she felt bad around the 19th of sept but it went away.  Now has returned.

## 2020-06-29 DIAGNOSIS — R7302 Impaired glucose tolerance (oral): Secondary | ICD-10-CM | POA: Insufficient documentation

## 2020-09-12 ENCOUNTER — Emergency Department (HOSPITAL_BASED_OUTPATIENT_CLINIC_OR_DEPARTMENT_OTHER): Payer: Self-pay

## 2020-09-12 ENCOUNTER — Encounter (HOSPITAL_BASED_OUTPATIENT_CLINIC_OR_DEPARTMENT_OTHER): Payer: Self-pay | Admitting: *Deleted

## 2020-09-12 ENCOUNTER — Emergency Department (HOSPITAL_BASED_OUTPATIENT_CLINIC_OR_DEPARTMENT_OTHER)
Admission: EM | Admit: 2020-09-12 | Discharge: 2020-09-12 | Disposition: A | Discharge status: Home / Self Care | Payer: Self-pay | Attending: Emergency Medicine | Admitting: Emergency Medicine

## 2020-09-12 ENCOUNTER — Other Ambulatory Visit: Payer: Self-pay

## 2020-09-12 DIAGNOSIS — R079 Chest pain, unspecified: Secondary | ICD-10-CM | POA: Insufficient documentation

## 2020-09-12 DIAGNOSIS — R072 Precordial pain: Secondary | ICD-10-CM

## 2020-09-12 LAB — CBC
HCT: 39.6 % (ref 36.0–46.0)
Hemoglobin: 13 g/dL (ref 12.0–15.0)
MCH: 28.8 pg (ref 26.0–34.0)
MCHC: 32.8 g/dL (ref 30.0–36.0)
MCV: 87.8 fL (ref 80.0–100.0)
Platelets: 332 10*3/uL (ref 150–400)
RBC: 4.51 MIL/uL (ref 3.87–5.11)
RDW: 13.3 % (ref 11.5–15.5)
WBC: 6.2 10*3/uL (ref 4.0–10.5)
nRBC: 0 % (ref 0.0–0.2)

## 2020-09-12 LAB — TROPONIN I (HIGH SENSITIVITY): Troponin I (High Sensitivity): <2 ng/L (ref <18)

## 2020-09-12 LAB — PREGNANCY, URINE: Preg Test, Ur: NEGATIVE

## 2020-09-12 MED ORDER — ALUM & MAG HYDROXIDE-SIMETH 200-200-20 MG/5ML PO SUSP
30.0000 mL | Freq: Once | ORAL | Status: AC
  Administered 2020-09-12: 30 mL via ORAL
  Filled 2020-09-12: qty 30

## 2020-09-12 MED ORDER — FAMOTIDINE 20 MG PO TABS
20.0000 mg | ORAL_TABLET | Freq: Once | ORAL | Status: AC
  Administered 2020-09-12: 20 mg via ORAL
  Filled 2020-09-12: qty 1

## 2020-09-12 MED ORDER — ACETAMINOPHEN 500 MG PO TABS
1000.0000 mg | ORAL_TABLET | Freq: Once | ORAL | Status: AC
  Administered 2020-09-12: 1000 mg via ORAL
  Filled 2020-09-12: qty 2

## 2020-09-12 NOTE — ED Notes (Signed)
Dr. Denton Lank notified that patient has questions for him.

## 2020-09-12 NOTE — ED Provider Notes (Addendum)
[facility] EMERGENCY DEPARTMENT Provider Note   CSN: [id number] Arrival date & time: 09/12/20  1615     History Chief Complaint  Patient presents with  . Chest Pain    Annette Ferguson is a 25 y.o. female.  Patient c/o mid chest pain for past 5-6 days. Symptoms occur at rest, constant, mild, non radiating, dull, not pleuritic. No association with activity or exertion. Pain is not pleuritic. No associated nv, diaphoresis or sob. No palpitations. Occasional heartburn sensation. No neck or back pain. No abd pain. No leg pain or swelling. No hx dvt or pe. No fam hx premature cad. Denies chest wall strain. Occasional non prod cough. No specific known covid ill contact, and indicates recent covid test negative. Denies fever/chills.   The history is provided by the patient.  Chest Pain Associated symptoms: no abdominal pain, no back pain, no fever, no headache, no nausea, no palpitations, no shortness of breath and no vomiting        Past Medical History:  Diagnosis Date  . Menstrual periods irregular   . Seasonal allergies   . Vitamin D deficiency     Patient Active Problem List   Diagnosis Date Noted  . Abdominal discomfort 05/08/2019  . Right ovarian cyst 05/08/2019  . Amenorrhea, secondary 03/14/2019    Past Surgical History:  Procedure Laterality Date  . ANAL FISSURE REPAIR       OB History    Gravida  0   Para  0   Term  0   Preterm  0   AB  0   Living  0     SAB  0   IAB  0   Ectopic  0   Multiple  0   Live Births  0           Family History  Problem Relation Age of Onset  . Diabetes Father     Social History   Tobacco Use  . Smoking status: Never Smoker  . Smokeless tobacco: Never Used  Vaping Use  . Vaping Use: Never used  Substance Use Topics  . Alcohol use: No  . Drug use: No    Home Medications Prior to Admission medications   Medication Sig Start Date End Date Taking? Authorizing Provider  cephALEXin  (KEFLEX) 500 MG capsule Take 1 capsule (500 mg total) by mouth 2 (two) times daily. 01/21/20   Bethel Born, PA-C  fluticasone (FLONASE) 50 MCG/ACT nasal spray Place 2 sprays into both nostrils daily. 12/28/19   Couture, Cortni S, PA-C  hydrocortisone (ANUSOL-HC) 25 MG suppository Place 1 suppository (25 mg total) rectally 2 (two) times daily. Patient not taking: Reported on 07/01/2019 02/22/19   Jacalyn Lefevre, MD  loratadine (CLARITIN) 10 MG tablet Take 1 tablet (10 mg total) by mouth daily. 12/28/19 01/27/20  Couture, Cortni S, PA-C  medroxyPROGESTERone (PROVERA) 10 MG tablet Take 1 tablet (10 mg total) by mouth daily. For 10 days Patient not taking: Reported on 05/08/2019 03/14/19   Adam Phenix, MD  methocarbamol (ROBAXIN) 500 MG tablet Take 1 tablet (500 mg total) by mouth every 8 (eight) hours as needed. Patient not taking: Reported on 07/01/2019 05/17/19   Petrucelli, Lelon Mast R, PA-C  naproxen (NAPROSYN) 500 MG tablet Take 1 tablet (500 mg total) by mouth 2 (two) times daily. Patient not taking: Reported on 07/01/2019 05/17/19   Petrucelli, Lelon Mast R, PA-C  Vitamin D, Ergocalciferol, (DRISDOL) 1.25 MG (50000 UT) CAPS capsule Take by mouth.  [provider]    Allergies    Patient has no known allergies.  Review of Systems   Review of Systems  Constitutional: Negative for fever.  HENT: Negative for sore throat.   Eyes: Negative for redness.  Respiratory: Negative for shortness of breath.   Cardiovascular: Positive for chest pain. Negative for palpitations and leg swelling.  Gastrointestinal: Negative for abdominal pain, nausea and vomiting.  Genitourinary: Negative for flank pain.  Musculoskeletal: Negative for back pain and neck pain.  Skin: Negative for rash.  Neurological: Negative for headaches.  Hematological: Does not bruise/bleed easily.  Psychiatric/Behavioral: Negative for confusion.    Physical Exam Updated Vital Signs BP 123/67 (BP Location: Left Arm)    Pulse 66   Temp 97.9 F (36.6 C) (Oral)   Resp 20   Ht 1.651 m (5\' 5" )   Wt 113.4 kg   LMP 08/25/2020   SpO2 100%   BMI 41.60 kg/m   Physical Exam Vitals and nursing note reviewed.  Constitutional:      Appearance: Normal appearance. She is well-developed.  HENT:     Head: Atraumatic.     Nose: Nose normal.     Mouth/Throat:     Mouth: Mucous membranes are moist.     Pharynx: Oropharynx is clear.  Eyes:     General: No scleral icterus.    Conjunctiva/sclera: Conjunctivae normal.  Neck:     Trachea: No tracheal deviation.  Cardiovascular:     Rate and Rhythm: Normal rate and regular rhythm.     Pulses: Normal pulses.     Heart sounds: Normal heart sounds. No murmur heard. No friction rub. No gallop.   Pulmonary:     Effort: Pulmonary effort is normal. No respiratory distress.     Breath sounds: Normal breath sounds.     Comments: Mild chest wall tenderness, no sts, no crepitus.  Chest:     Chest wall: Tenderness present.  Abdominal:     General: Bowel sounds are normal. There is no distension.     Palpations: Abdomen is soft.     Tenderness: There is no abdominal tenderness.  Genitourinary:    Comments: No cva tenderness.  Musculoskeletal:        General: No swelling or tenderness.     Cervical back: Normal range of motion and neck supple. No rigidity. No muscular tenderness.     Right lower leg: No edema.     Left lower leg: No edema.  Skin:    General: Skin is warm and dry.     Findings: No rash.  Neurological:     Mental Status: She is alert.     Comments: Alert, speech normal.   Psychiatric:        Mood and Affect: Mood normal.     ED Results / Procedures / Treatments   Labs (all labs ordered are listed, but only abnormal results are displayed) Results for orders placed or performed during the hospital encounter of 09/12/20  CBC  Result Value Ref Range   WBC 6.2 4.0 - 10.5 K/uL   RBC 4.51 3.87 - 5.11 MIL/uL   Hemoglobin 13.0 12.0 - 15.0 g/dL    HCT 11/10/20 82.9 - 56.2 %   MCV 87.8 80.0 - 100.0 fL   MCH 28.8 26.0 - 34.0 pg   MCHC 32.8 30.0 - 36.0 g/dL   RDW 13.0 86.5 - 78.4 %   Platelets 332 150 - 400 K/uL   nRBC 0.0 0.0 - 0.2 %  Pregnancy, urine  Result Value Ref Range   Preg Test, Ur NEGATIVE NEGATIVE  Troponin I (High Sensitivity)  Result Value Ref Range   Troponin I (High Sensitivity) <2 <18 ng/L   DG Chest 2 View  Result Date: 09/12/2020 CLINICAL DATA:  Chest pain, shortness of breath. EXAM: CHEST - 2 VIEW COMPARISON:  March 09, 2020. FINDINGS: The heart size and mediastinal contours are within normal limits. Both lungs are clear. No pneumothorax or pleural effusion is noted. The visualized skeletal structures are unremarkable. IMPRESSION: No active cardiopulmonary disease. Electronically Signed   By: Lupita Raider M.D.   On: 09/12/2020 16:56    EKG EKG Interpretation  Date/Time:  Saturday September 12 2020 16:10:41 EST Ventricular Rate:  70 PR Interval:  178 QRS Duration: 82 QT Interval:  380 QTC Calculation: 410 R Axis:   70 Text Interpretation: Normal sinus rhythm Nonspecific T wave abnormality Confirmed by Cathren Laine ([id number]) on 09/12/2020 5:24:16 PM   Radiology DG Chest 2 View  Result Date: 09/12/2020 CLINICAL DATA:  Chest pain, shortness of breath. EXAM: CHEST - 2 VIEW COMPARISON:  March 09, 2020. FINDINGS: The heart size and mediastinal contours are within normal limits. Both lungs are clear. No pneumothorax or pleural effusion is noted. The visualized skeletal structures are unremarkable. IMPRESSION: No active cardiopulmonary disease. Electronically Signed   By: Lupita Raider M.D.   On: 09/12/2020 16:56    Procedures Procedures   Medications Ordered in ED Medications  acetaminophen (TYLENOL) tablet 1,000 mg (has no administration in time range)  alum & mag hydroxide-simeth (MAALOX/MYLANTA) 200-200-20 MG/5ML suspension 30 mL (has no administration in time range)  famotidine (PEPCID) tablet 20 mg (has no  administration in time range)    ED Course  I have reviewed the triage vital signs and the nursing notes.  Pertinent labs & imaging results that were available during my care of the patient were reviewed by me and considered in my medical decision making (see chart for details).    MDM Rules/Calculators/A&P                         Labs and xrays ordered.   Reviewed nursing notes and prior charts for additional history. Prior ED eval for chest pain noted, no acute process noted then. Prior cta chest for cp neg.  Prior stress echo neg.   Labs reviewed/interpreted by me - trop normal. After prolonged symptoms at rest, trop normal/2, felt not c/w acs.   CXR reviewed/interpreted by me - no pna.   ECG appears similar to prior. Trop normal.   Acetaminophen po, pepcid po, maalox po, for symptom relief.   Pt is comfortable appearing, no distress, breathing normally. Hr  64, rr 16, pulse ox 100% room air.   Pt currently appears stable for d/c.   Rec pcp f/u.    Final Clinical Impression(s) / ED Diagnoses Final diagnoses:  None    Rx / DC Orders ED Discharge Orders    None           Cathren Laine, MD 09/12/20 [phone]

## 2020-09-12 NOTE — ED Triage Notes (Addendum)
Pt reports chest pain x 1 week. States feels like pressure throughout chest and is worse with a deep breath. She had a negative covid test last Sunday. She has had palpitations in the past which she has been told may be related to thyroid but this pain is different. She has had her covid vaccine

## 2020-09-12 NOTE — ED Notes (Signed)
ED Provider at bedside. 

## 2020-09-12 NOTE — ED Notes (Signed)
EDP stated that she does not need any IV.  He will assess her lab works as well as her xray.

## 2020-09-12 NOTE — Discharge Instructions (Addendum)
It was our pleasure to provide your ER care today - we hope that you feel better.  Your blood work/heart test, and chest xray look good or normal.   Take acetaminophen or ibuprofen as need for symptom relief.  If heartburn, you may try pepcid or maalox as need.  Follow up with primary care doctor in the coming week.  Return to ER if worse, new symptoms, fevers, increased trouble breathing, persistent/recurrent chest pain, persistent fast heart beating, or other concern.

## 2021-04-21 ENCOUNTER — Emergency Department (HOSPITAL_BASED_OUTPATIENT_CLINIC_OR_DEPARTMENT_OTHER): Payer: Self-pay

## 2021-04-21 ENCOUNTER — Encounter (HOSPITAL_BASED_OUTPATIENT_CLINIC_OR_DEPARTMENT_OTHER): Payer: Self-pay

## 2021-04-21 ENCOUNTER — Other Ambulatory Visit: Payer: Self-pay

## 2021-04-21 ENCOUNTER — Emergency Department (HOSPITAL_BASED_OUTPATIENT_CLINIC_OR_DEPARTMENT_OTHER)
Admission: EM | Admit: 2021-04-21 | Discharge: 2021-04-21 | Disposition: A | Discharge status: Home / Self Care | Payer: Self-pay | Attending: Emergency Medicine | Admitting: Emergency Medicine

## 2021-04-21 DIAGNOSIS — R0789 Other chest pain: Secondary | ICD-10-CM

## 2021-04-21 DIAGNOSIS — Z20822 Contact with and (suspected) exposure to covid-19: Secondary | ICD-10-CM | POA: Insufficient documentation

## 2021-04-21 DIAGNOSIS — K219 Gastro-esophageal reflux disease without esophagitis: Secondary | ICD-10-CM | POA: Insufficient documentation

## 2021-04-21 LAB — BASIC METABOLIC PANEL
Anion gap: 6 (ref 5–15)
BUN: 15 mg/dL (ref 6–20)
CO2: 25 mmol/L (ref 22–32)
Calcium: 8.7 mg/dL — ABNORMAL LOW (ref 8.9–10.3)
Chloride: 104 mmol/L (ref 98–111)
Creatinine, Ser: 0.56 mg/dL (ref 0.44–1.00)
GFR, Estimated: >60 mL/min (ref >60)
Glucose, Bld: 88 mg/dL (ref 70–99)
Potassium: 3.5 mmol/L (ref 3.5–5.1)
Sodium: 135 mmol/L (ref 135–145)

## 2021-04-21 LAB — TROPONIN I (HIGH SENSITIVITY)
Troponin I (High Sensitivity): <2 ng/L (ref <18)
Troponin I (High Sensitivity): <2 ng/L (ref <18)

## 2021-04-21 LAB — CBC
HCT: 35 % — ABNORMAL LOW (ref 36.0–46.0)
Hemoglobin: 11.8 g/dL — ABNORMAL LOW (ref 12.0–15.0)
MCH: 28.9 pg (ref 26.0–34.0)
MCHC: 33.7 g/dL (ref 30.0–36.0)
MCV: 85.6 fL (ref 80.0–100.0)
Platelets: 323 10*3/uL (ref 150–400)
RBC: 4.09 MIL/uL (ref 3.87–5.11)
RDW: 13.2 % (ref 11.5–15.5)
WBC: 5.2 10*3/uL (ref 4.0–10.5)
nRBC: 0 % (ref 0.0–0.2)

## 2021-04-21 LAB — SARS CORONAVIRUS 2 (TAT 6-24 HRS): SARS Coronavirus 2: NEGATIVE

## 2021-04-21 LAB — PREGNANCY, URINE: Preg Test, Ur: NEGATIVE

## 2021-04-21 MED ORDER — OMEPRAZOLE 20 MG PO CPDR
20.0000 mg | DELAYED_RELEASE_CAPSULE | Freq: Every day | ORAL | 0 refills | Status: DC
Start: 2021-04-21 — End: 2022-12-23

## 2021-04-21 MED ORDER — LIDOCAINE VISCOUS HCL 2 % MT SOLN
15.0000 mL | Freq: Once | OROMUCOSAL | Status: AC
  Administered 2021-04-21: 15 mL via ORAL
  Filled 2021-04-21: qty 15

## 2021-04-21 MED ORDER — ALUM & MAG HYDROXIDE-SIMETH 200-200-20 MG/5ML PO SUSP
30.0000 mL | Freq: Once | ORAL | Status: AC
  Administered 2021-04-21: 30 mL via ORAL
  Filled 2021-04-21: qty 30

## 2021-04-21 NOTE — ED Provider Notes (Signed)
[facility] EMERGENCY DEPARTMENT Provider Note   CSN: [id number] Arrival date & time: 04/21/21  0636     History Chief Complaint  Patient presents with   Chest Pain    Annette Ferguson is a 25 y.o. female.  Patient is a 25 year old female who presents with chest pain.  She reports that she has had some heartburn for the last couple weeks and she has been trying to change her diet to improve this as well as lose some weight.  She says this morning she woke up about 6 AM with some pain in the center of her chest.  It radiates to her bilateral arms.  Its been constant since she woke up.  Its not worse with exertion.  Its not pleuritic.  There is no associated cough or cold symptoms.  She has a little associated shortness of breath.  She has no associated abdominal pain.  No nausea or vomiting.  No fevers.  No recent illnesses.  She has had history of chest pain in the past.  She states she followed up with a cardiologist and they did not find anything abnormal with her heart.      Past Medical History:  Diagnosis Date   Menstrual periods irregular    Seasonal allergies    Vitamin D deficiency     Patient Active Problem List   Diagnosis Date Noted   Abdominal discomfort 05/08/2019   Right ovarian cyst 05/08/2019   Amenorrhea, secondary 03/14/2019    Past Surgical History:  Procedure Laterality Date   ANAL FISSURE REPAIR       OB History     Gravida  0   Para  0   Term  0   Preterm  0   AB  0   Living  0      SAB  0   IAB  0   Ectopic  0   Multiple  0   Live Births  0           Family History  Problem Relation Age of Onset   Diabetes Father     Social History   Tobacco Use   Smoking status: Never   Smokeless tobacco: Never  Vaping Use   Vaping Use: Never used  Substance Use Topics   Alcohol use: No   Drug use: No    Home Medications Prior to Admission medications   Medication Sig Start Date End Date Taking? Authorizing  Provider  omeprazole (PRILOSEC) 20 MG capsule Take 1 capsule (20 mg total) by mouth daily. 04/21/21  Yes Rolan Bucco, MD  cephALEXin (KEFLEX) 500 MG capsule Take 1 capsule (500 mg total) by mouth 2 (two) times daily. 01/21/20   Bethel Born, PA-C  fluticasone (FLONASE) 50 MCG/ACT nasal spray Place 2 sprays into both nostrils daily. 12/28/19   Couture, Cortni S, PA-C  hydrocortisone (ANUSOL-HC) 25 MG suppository Place 1 suppository (25 mg total) rectally 2 (two) times daily. Patient not taking: No sig reported 02/22/19   Jacalyn Lefevre, MD  loratadine (CLARITIN) 10 MG tablet Take 1 tablet (10 mg total) by mouth daily. 12/28/19 01/27/20  Couture, Cortni S, PA-C  medroxyPROGESTERone (PROVERA) 10 MG tablet Take 1 tablet (10 mg total) by mouth daily. For 10 days Patient not taking: No sig reported 03/14/19   Adam Phenix, MD  methocarbamol (ROBAXIN) 500 MG tablet Take 1 tablet (500 mg total) by mouth every 8 (eight) hours as needed. Patient not taking: No sig reported  05/17/19   Petrucelli, Samantha R, PA-C  naproxen (NAPROSYN) 500 MG tablet Take 1 tablet (500 mg total) by mouth 2 (two) times daily. Patient not taking: No sig reported 05/17/19   Petrucelli, Samantha R, PA-C  Vitamin D, Ergocalciferol, (DRISDOL) 1.25 MG (50000 UT) CAPS capsule Take by mouth.    [provider]    Allergies    Patient has no known allergies.  Review of Systems   Review of Systems  Constitutional:  Negative for chills, diaphoresis, fatigue and fever.  HENT:  Negative for congestion, rhinorrhea and sneezing.   Eyes: Negative.   Respiratory:  Positive for chest tightness and shortness of breath. Negative for cough.   Cardiovascular:  Positive for chest pain. Negative for leg swelling.  Gastrointestinal:  Negative for abdominal pain, blood in stool, diarrhea, nausea and vomiting.  Genitourinary:  Negative for difficulty urinating, flank pain, frequency and hematuria.  Musculoskeletal:  Negative for  arthralgias and back pain.  Skin:  Negative for rash.  Neurological:  Negative for dizziness, speech difficulty, weakness, numbness and headaches.   Physical Exam Updated Vital Signs BP 105/66 (BP Location: Right Arm)   Pulse 67   Temp 98.9 F (37.2 C) (Oral)   Resp 12   Ht 5\' 4"  (1.626 m)   Wt 113.4 kg   LMP 04/18/2021 (Exact Date)   SpO2 100%   BMI 42.91 kg/m   Physical Exam Constitutional:      Appearance: She is well-developed.  HENT:     Head: Normocephalic and atraumatic.  Eyes:     Pupils: Pupils are equal, round, and reactive to light.  Cardiovascular:     Rate and Rhythm: Normal rate and regular rhythm.     Heart sounds: Normal heart sounds.  Pulmonary:     Effort: Pulmonary effort is normal. No respiratory distress.     Breath sounds: Normal breath sounds. No wheezing or rales.  Chest:     Chest wall: No tenderness.  Abdominal:     General: Bowel sounds are normal.     Palpations: Abdomen is soft.     Tenderness: There is no abdominal tenderness. There is no guarding or rebound.  Musculoskeletal:        General: Normal range of motion.     Cervical back: Normal range of motion and neck supple.     Comments: No edema or calf tenderness  Lymphadenopathy:     Cervical: No cervical adenopathy.  Skin:    General: Skin is warm and dry.     Findings: No rash.  Neurological:     Mental Status: She is alert and oriented to person, place, and time.    ED Results / Procedures / Treatments   Labs (all labs ordered are listed, but only abnormal results are displayed) Labs Reviewed  BASIC METABOLIC PANEL - Abnormal; Notable for the following components:      Result Value   Calcium 8.7 (*)    All other components within normal limits  CBC - Abnormal; Notable for the following components:   Hemoglobin 11.8 (*)    HCT 35.0 (*)    All other components within normal limits  SARS CORONAVIRUS 2 (TAT 6-24 HRS)  PREGNANCY, URINE  TROPONIN I (HIGH SENSITIVITY)   TROPONIN I (HIGH SENSITIVITY)    EKG EKG Interpretation  Date/Time:  Wednesday April 21 2021 06:47:12 EDT Ventricular Rate:  77 PR Interval:  192 QRS Duration: 96 QT Interval:  436 QTC Calculation: 494 R Axis:  84 Text Interpretation: Sinus rhythm Borderline T abnormalities, anterior leads Confirmed by Nicanor Alcon, April ([id number]) on 04/21/2021 6:48:58 AM  Radiology DG Chest 2 View  Result Date: 04/21/2021 CLINICAL DATA:  Chest pressure. EXAM: CHEST - 2 VIEW COMPARISON:  09/12/2020 FINDINGS: The lungs are clear without focal pneumonia, edema, pneumothorax or pleural effusion. The cardiopericardial silhouette is within normal limits for size. The visualized bony structures of the thorax show no acute abnormality. Telemetry leads overlie the chest. IMPRESSION: No acute cardiopulmonary findings. Electronically Signed   By: Kennith Center M.D.   On: 04/21/2021 07:30    Procedures Procedures   Medications Ordered in ED Medications  alum & mag hydroxide-simeth (MAALOX/MYLANTA) 200-200-20 MG/5ML suspension 30 mL (30 mLs Oral Given 04/21/21 0742)    And  lidocaine (XYLOCAINE) 2 % viscous mouth solution 15 mL (15 mLs Oral Given 04/21/21 6045)    ED Course  I have reviewed the triage vital signs and the nursing notes.  Pertinent labs & imaging results that were available during my care of the patient were reviewed by me and considered in my medical decision making (see chart for details).    MDM Rules/Calculators/A&P                           Patient is a 25 year old female who presents with chest pain.  She has no pleuritic pain or exertional symptoms.  No hypoxia, tachycardia or other symptoms that would be more concerning for PE.  Her chest x-ray is clear without evidence of pneumonia or pneumothorax.  Her EKG does not show any ischemic changes.  She has had 2 negative troponins.  Her pain has improved with a GI cocktail in the ED.  We will treat her with Prilosec.  Advised her to have  follow-up with her PCP to ensure improvement in symptoms.  Return precautions were given. Final Clinical Impression(s) / ED Diagnoses Final diagnoses:  Atypical chest pain  Gastroesophageal reflux disease without esophagitis    Rx / DC Orders ED Discharge Orders          Ordered    omeprazole (PRILOSEC) 20 MG capsule  Daily        04/21/21 1105             Rolan Bucco, MD 04/21/21 1108

## 2021-04-21 NOTE — ED Notes (Signed)
Pt states unable to void at present.

## 2021-04-21 NOTE — ED Triage Notes (Signed)
Pt states she woke up at 6am with sudden onset of centralized chest heaviness that radiates to her back and armpits associated with SOB and lightheadedness.

## 2021-06-10 IMAGING — CR DG FOOT COMPLETE 3+V*L*
3 series · 3 of 3 positions shown · non-contrast
Comparison: June 22, 2019

CLINICAL DATA: Pain following rolling injury

EXAM:
LEFT FOOT - COMPLETE 3+ VIEW

[t foot ap left]
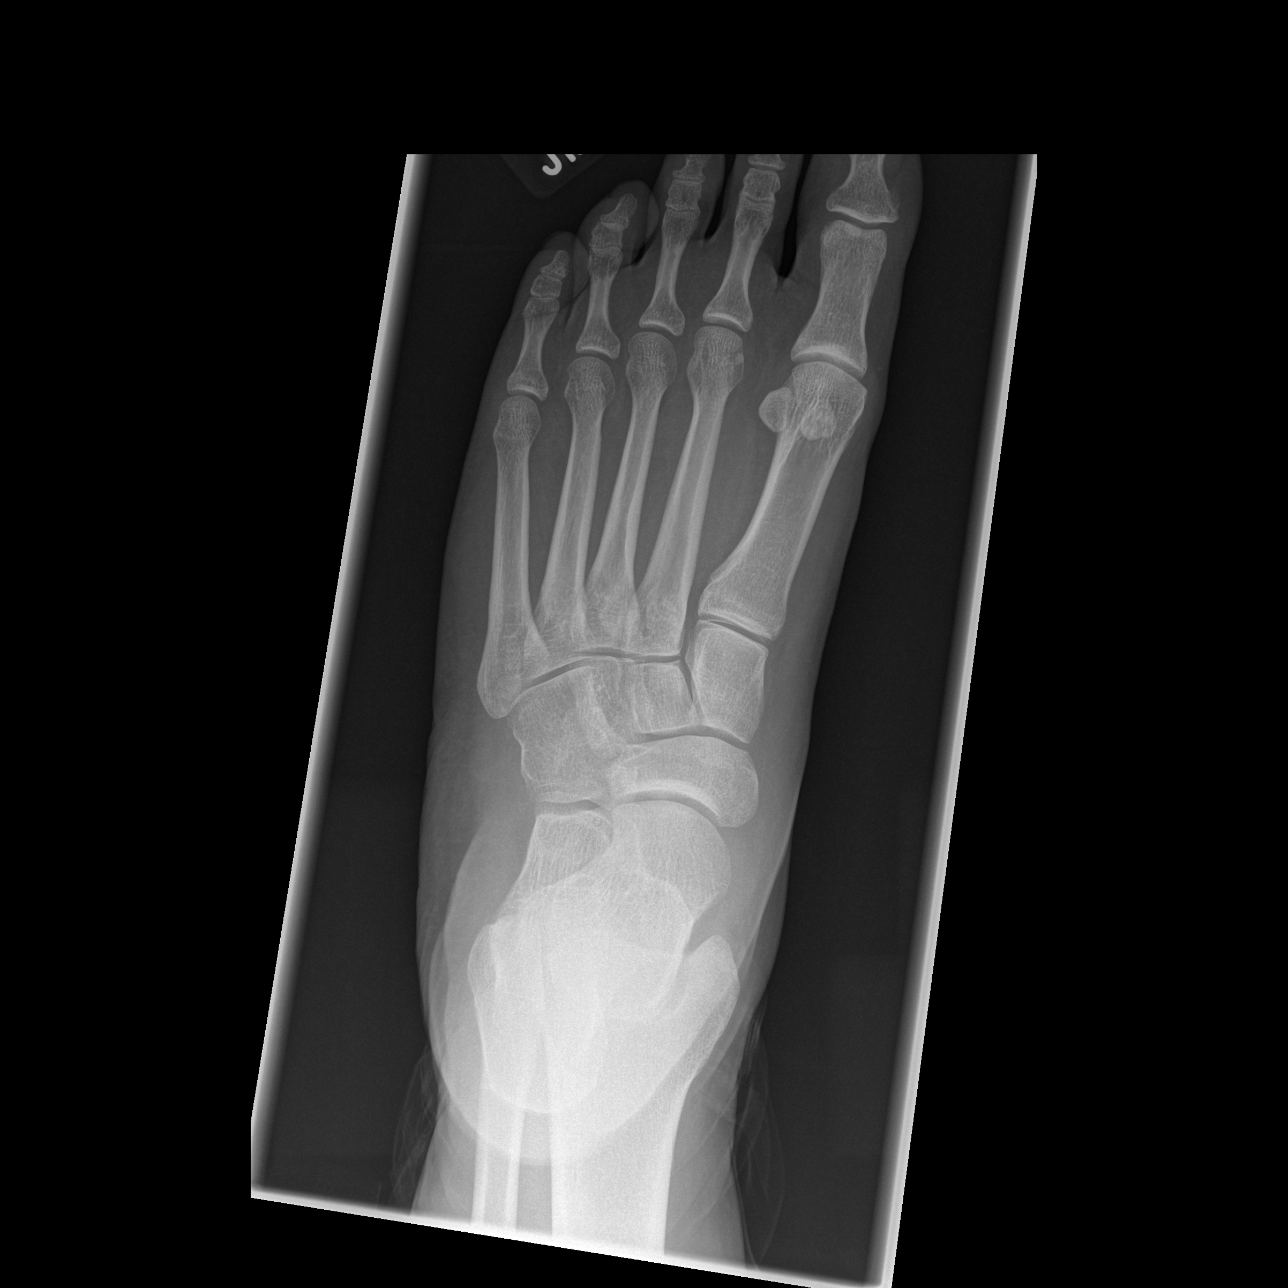

[t foot oblique left]
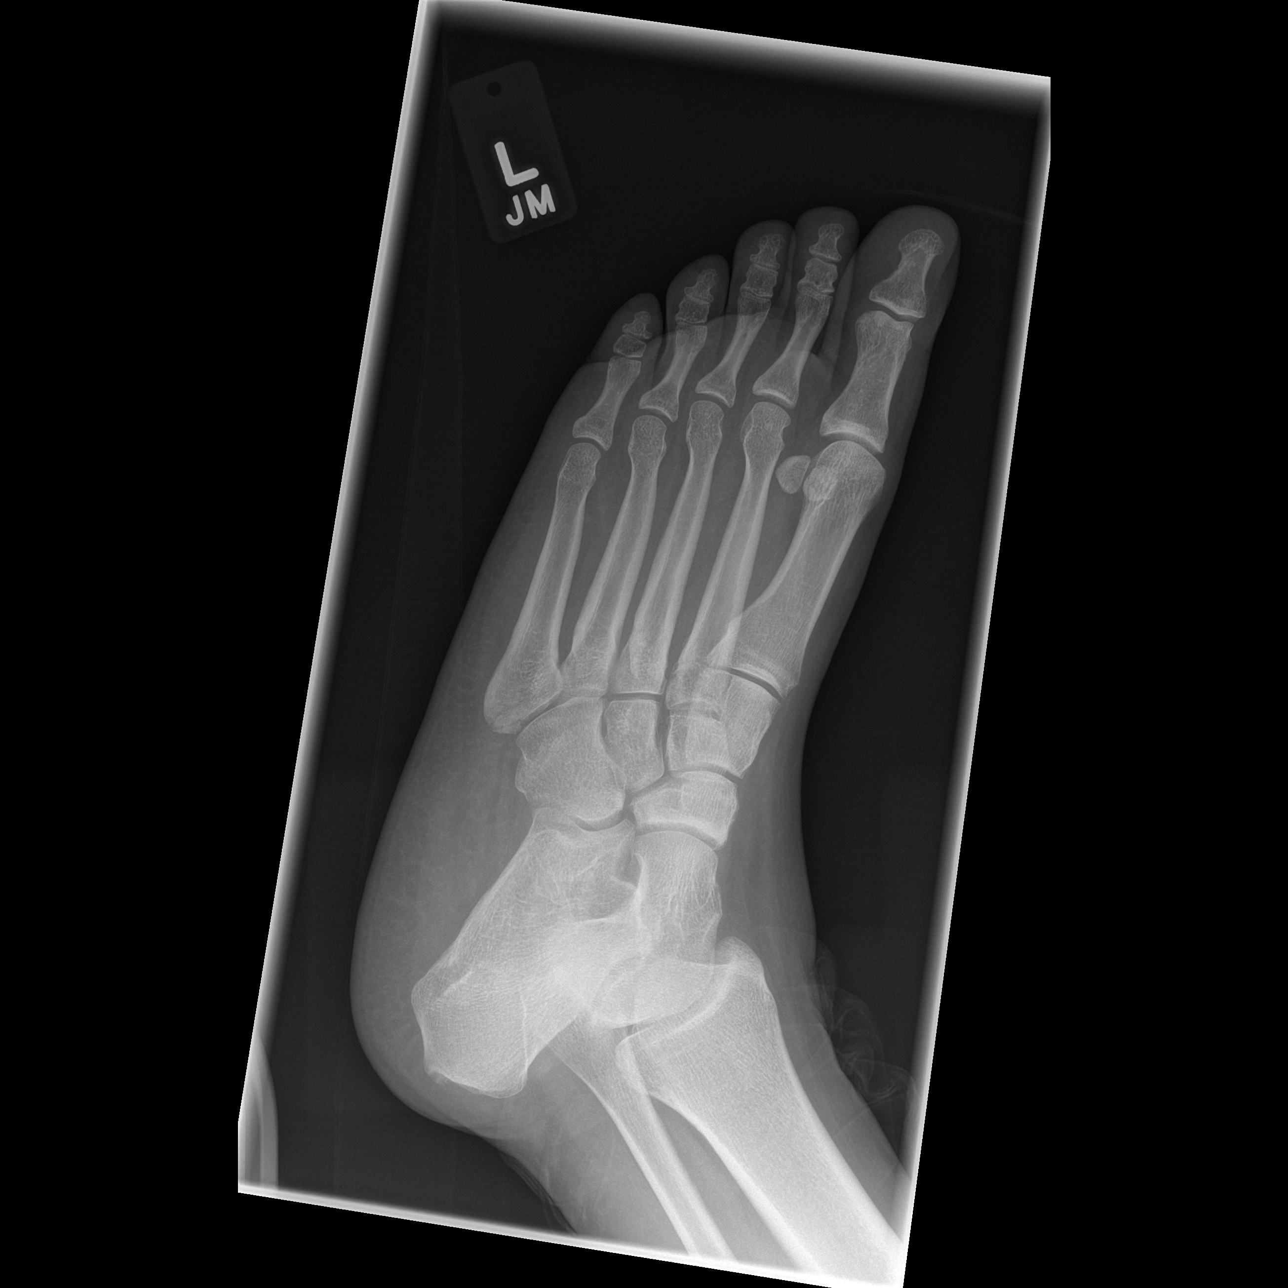

[t foot lat left]
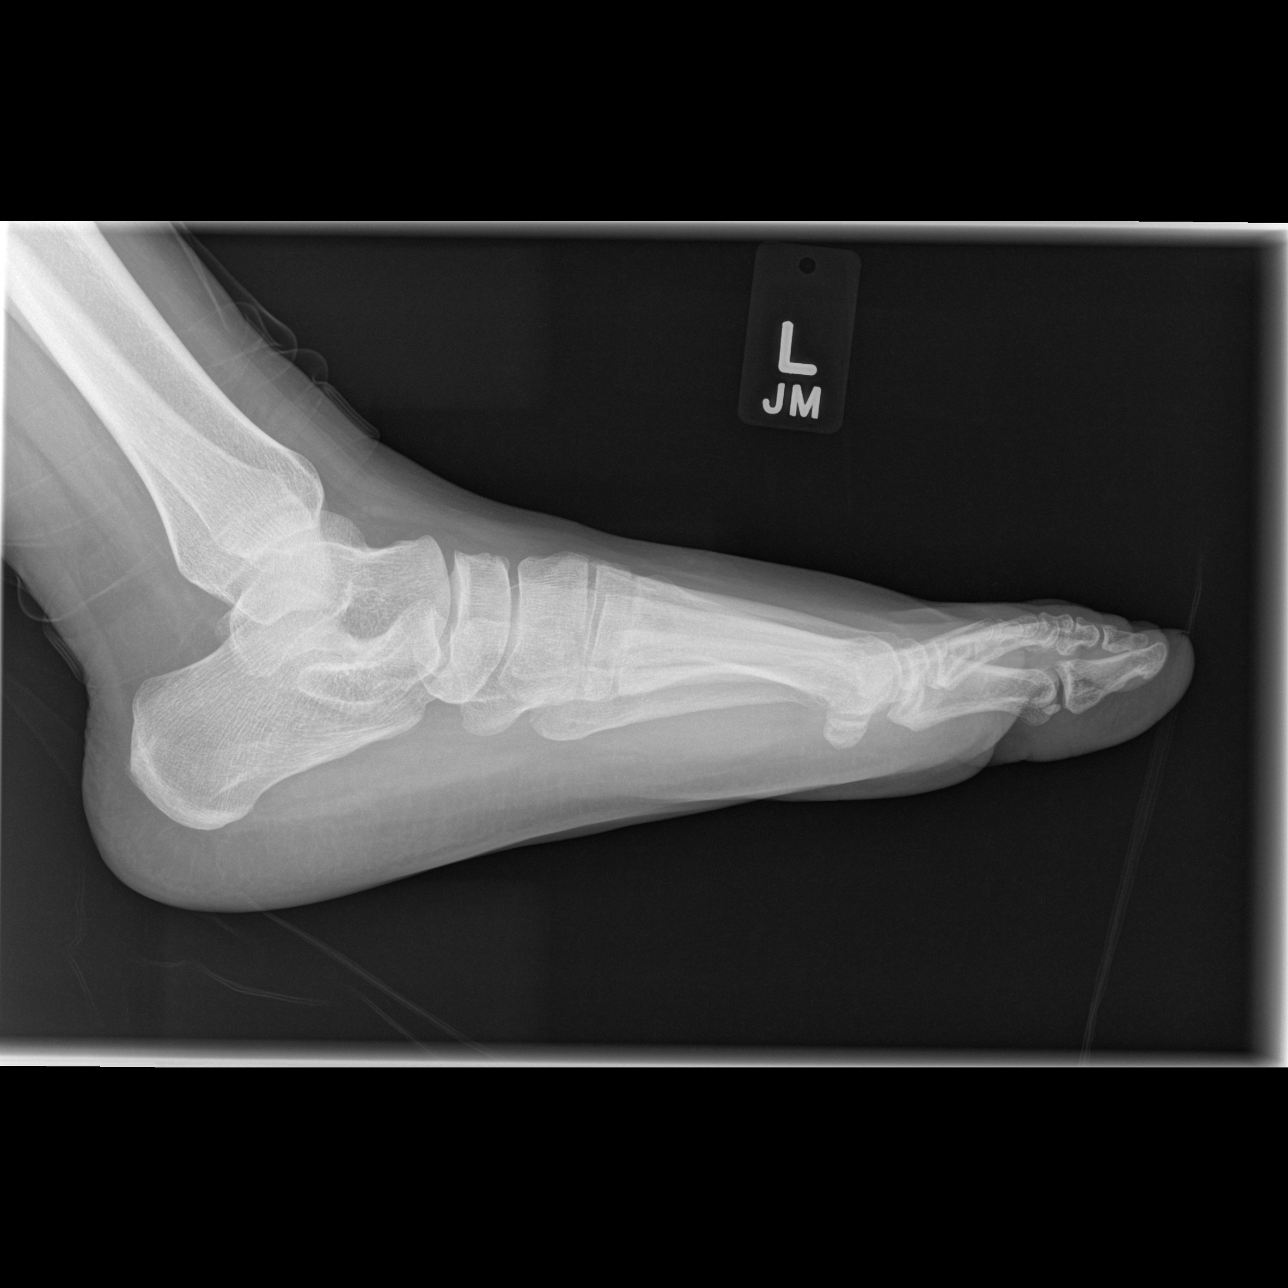

[3 of 3 positions shown; findings below may reference images not displayed]

FINDINGS: Frontal, oblique, and lateral views obtained. There is again noted a
fracture along the proximal most aspect of the fifth metatarsal with
minimal displacement of fracture fragments. There is a second area
of fracture, incomplete, in the proximal fifth metatarsal with
alignment essentially anatomic. No other fractures are evident. No
dislocation. Joint spaces appear normal. No erosive change.
IMPRESSION: There are fractures of the proximal fifth metatarsal. Along the
proximal most aspect of the fifth metatarsal, there is slight
displacement of fracture fragments. A second area of fracture is
incomplete and in anatomic alignment, involving the lateral aspect
of the base of the fifth metatarsal. No other fractures are evident.
No dislocation. No appreciable arthropathy.

## 2021-11-03 ENCOUNTER — Ambulatory Visit: Payer: Medicaid Other | Admitting: Obstetrics & Gynecology

## 2022-01-13 ENCOUNTER — Ambulatory Visit: Payer: Medicaid Other | Admitting: Obstetrics & Gynecology

## 2022-02-10 ENCOUNTER — Encounter (HOSPITAL_BASED_OUTPATIENT_CLINIC_OR_DEPARTMENT_OTHER): Payer: Self-pay | Admitting: Emergency Medicine

## 2022-02-10 ENCOUNTER — Emergency Department (HOSPITAL_BASED_OUTPATIENT_CLINIC_OR_DEPARTMENT_OTHER)
Admission: EM | Admit: 2022-02-10 | Discharge: 2022-02-10 | Disposition: A | Discharge status: Home / Self Care | Payer: Self-pay | Attending: Emergency Medicine | Admitting: Emergency Medicine

## 2022-02-10 ENCOUNTER — Emergency Department (HOSPITAL_BASED_OUTPATIENT_CLINIC_OR_DEPARTMENT_OTHER): Payer: Self-pay

## 2022-02-10 ENCOUNTER — Other Ambulatory Visit: Payer: Self-pay

## 2022-02-10 DIAGNOSIS — R110 Nausea: Secondary | ICD-10-CM | POA: Insufficient documentation

## 2022-02-10 DIAGNOSIS — R079 Chest pain, unspecified: Secondary | ICD-10-CM

## 2022-02-10 DIAGNOSIS — M549 Dorsalgia, unspecified: Secondary | ICD-10-CM | POA: Insufficient documentation

## 2022-02-10 DIAGNOSIS — R072 Precordial pain: Secondary | ICD-10-CM | POA: Insufficient documentation

## 2022-02-10 LAB — CBC
HCT: 37.9 % (ref 36.0–46.0)
Hemoglobin: 12.3 g/dL (ref 12.0–15.0)
MCH: 28.2 pg (ref 26.0–34.0)
MCHC: 32.5 g/dL (ref 30.0–36.0)
MCV: 86.9 fL (ref 80.0–100.0)
Platelets: 302 10*3/uL (ref 150–400)
RBC: 4.36 MIL/uL (ref 3.87–5.11)
RDW: 13.2 % (ref 11.5–15.5)
WBC: 5.2 10*3/uL (ref 4.0–10.5)
nRBC: 0 % (ref 0.0–0.2)

## 2022-02-10 LAB — PREGNANCY, URINE: Preg Test, Ur: NEGATIVE

## 2022-02-10 LAB — BASIC METABOLIC PANEL
Anion gap: 5 (ref 5–15)
BUN: 19 mg/dL (ref 6–20)
CO2: 27 mmol/L (ref 22–32)
Calcium: 9.1 mg/dL (ref 8.9–10.3)
Chloride: 104 mmol/L (ref 98–111)
Creatinine, Ser: 0.77 mg/dL (ref 0.44–1.00)
GFR, Estimated: >60 mL/min (ref >60)
Glucose, Bld: 93 mg/dL (ref 70–99)
Potassium: 3.9 mmol/L (ref 3.5–5.1)
Sodium: 136 mmol/L (ref 135–145)

## 2022-02-10 LAB — TROPONIN I (HIGH SENSITIVITY)
Troponin I (High Sensitivity): <2 ng/L (ref <18)
Troponin I (High Sensitivity): 2 ng/L (ref <18)

## 2022-02-10 MED ORDER — FAMOTIDINE 20 MG PO TABS: 20.0000 mg | ORAL_TABLET | Freq: Two times a day (BID) | ORAL | 0 refills | Status: DC

## 2022-02-10 MED ORDER — FAMOTIDINE 20 MG PO TABS
20.0000 mg | ORAL_TABLET | Freq: Once | ORAL | Status: AC
  Administered 2022-02-10: 20 mg via ORAL
  Filled 2022-02-10: qty 1

## 2022-02-10 MED ORDER — ALUM & MAG HYDROXIDE-SIMETH 200-200-20 MG/5ML PO SUSP
30.0000 mL | Freq: Once | ORAL | Status: AC
  Administered 2022-02-10: 30 mL via ORAL
  Filled 2022-02-10: qty 30

## 2022-02-10 MED ORDER — ACETAMINOPHEN 325 MG PO TABS
650.0000 mg | ORAL_TABLET | Freq: Once | ORAL | Status: AC
  Administered 2022-02-10: 650 mg via ORAL
  Filled 2022-02-10: qty 2

## 2022-02-10 NOTE — Discharge Instructions (Signed)
You were seen in the emergency department for chest pain.  You had blood work EKG and a chest x-ray that did not show any evidence of cardiac injury.  This is possibly related to acid reflux.  We are prescribing you some acid medication and you can also use Maalox in between meals and at bedtime.  Follow-up with your regular doctor.  Return to the emergency department if any worsening or concerning symptoms.

## 2022-02-10 NOTE — ED Provider Notes (Signed)
[facility] EMERGENCY DEPARTMENT Provider Note   CSN: [id number] Arrival date & time: 02/10/22  1445     History  Chief Complaint  Patient presents with   Chest Pain    Annette Ferguson is a 26 y.o. female.  She is here with a complaint of some central chest pain burning in nature radiating through to her back and anterior axilla bilaterally into her abdomen and her upper legs that have been going on for a few days.  Does not seem to change with activity or eating.  No shortness of breath.  She tried an aspirin today without any improvement.  Associated with a little bit of nausea.  Rates the pain as 9 out of 10.  She said she has no chest pain before but no history of cardiac disease.  Non-smoker.  The history is provided by the patient.  Chest Pain Pain location:  Substernal area Pain quality: burning and pressure   Pain radiates to:  L shoulder, R shoulder and mid back Pain severity:  Severe Onset quality:  Gradual Duration:  3 days Timing:  Intermittent Progression:  Unchanged Chronicity:  New Relieved by:  Nothing Worsened by:  Nothing Ineffective treatments:  Aspirin Associated symptoms: back pain and nausea   Associated symptoms: no cough, no diaphoresis, no fever, no heartburn, no shortness of breath and no vomiting   Risk factors: no prior DVT/PE, no smoking and no surgery        Home Medications Prior to Admission medications   Medication Sig Start Date End Date Taking? Authorizing Provider  cephALEXin (KEFLEX) 500 MG capsule Take 1 capsule (500 mg total) by mouth 2 (two) times daily. 01/21/20   Bethel Born, PA-C  fluticasone (FLONASE) 50 MCG/ACT nasal spray Place 2 sprays into both nostrils daily. 12/28/19   Couture, Cortni S, PA-C  hydrocortisone (ANUSOL-HC) 25 MG suppository Place 1 suppository (25 mg total) rectally 2 (two) times daily. Patient not taking: No sig reported 02/22/19   Jacalyn Lefevre, MD  loratadine (CLARITIN) 10 MG tablet Take  1 tablet (10 mg total) by mouth daily. 12/28/19 01/27/20  Couture, Cortni S, PA-C  medroxyPROGESTERone (PROVERA) 10 MG tablet Take 1 tablet (10 mg total) by mouth daily. For 10 days Patient not taking: No sig reported 03/14/19   Adam Phenix, MD  methocarbamol (ROBAXIN) 500 MG tablet Take 1 tablet (500 mg total) by mouth every 8 (eight) hours as needed. Patient not taking: No sig reported 05/17/19   Petrucelli, Samantha R, PA-C  naproxen (NAPROSYN) 500 MG tablet Take 1 tablet (500 mg total) by mouth 2 (two) times daily. Patient not taking: No sig reported 05/17/19   Petrucelli, Samantha R, PA-C  omeprazole (PRILOSEC) 20 MG capsule Take 1 capsule (20 mg total) by mouth daily. 04/21/21   Rolan Bucco, MD  Vitamin D, Ergocalciferol, (DRISDOL) 1.25 MG (50000 UT) CAPS capsule Take by mouth.    [provider]      Allergies    Patient has no known allergies.    Review of Systems   Review of Systems  Constitutional:  Negative for diaphoresis and fever.  HENT:  Negative for sore throat.   Eyes:  Negative for visual disturbance.  Respiratory:  Negative for cough and shortness of breath.   Cardiovascular:  Positive for chest pain.  Gastrointestinal:  Positive for nausea. Negative for heartburn and vomiting.  Genitourinary:  Negative for dysuria.  Musculoskeletal:  Positive for back pain.  Skin:  Negative for  rash.    Physical Exam Updated Vital Signs BP 120/68 (BP Location: Left Arm)   Pulse 76   Temp 98.5 F (36.9 C) (Oral)   Resp 18   Ht 5\' 5"  (1.651 m)   Wt 113.4 kg   LMP 12/12/2021 (Approximate)   SpO2 100%   BMI 41.60 kg/m  Physical Exam Vitals and nursing note reviewed.  Constitutional:      General: She is not in acute distress.    Appearance: She is well-developed.  HENT:     Head: Normocephalic and atraumatic.  Eyes:     Conjunctiva/sclera: Conjunctivae normal.  Cardiovascular:     Rate and Rhythm: Normal rate and regular rhythm.     Heart sounds: Normal  heart sounds. No murmur heard. Pulmonary:     Effort: Pulmonary effort is normal. No respiratory distress.     Breath sounds: Normal breath sounds.  Abdominal:     Palpations: Abdomen is soft.     Tenderness: There is no abdominal tenderness.  Musculoskeletal:        General: No swelling.     Cervical back: Neck supple.     Right lower leg: No tenderness.     Left lower leg: No tenderness.  Skin:    General: Skin is warm and dry.     Capillary Refill: Capillary refill takes less than 2 seconds.  Neurological:     General: No focal deficit present.     Mental Status: She is alert.     ED Results / Procedures / Treatments   Labs (all labs ordered are listed, but only abnormal results are displayed) Labs Reviewed  BASIC METABOLIC PANEL  CBC  PREGNANCY, URINE  TROPONIN I (HIGH SENSITIVITY)  TROPONIN I (HIGH SENSITIVITY)    EKG EKG Interpretation  Date/Time:  Thursday February 10 2022 15:10:27 EDT Ventricular Rate:  75 PR Interval:  182 QRS Duration: 78 QT Interval:  370 QTC Calculation: 413 R Axis:   90 Text Interpretation: Normal sinus rhythm Rightward axis Cannot rule out Anterior infarct , age undetermined Abnormal ECG When compared with ECG of 21-Apr-2021 06:47, No significant change since last tracing Confirmed by 23-Apr-2021 [phone]) on 02/10/2022 3:30:54 PM  Radiology DG Chest 2 View  Result Date: 02/10/2022 CLINICAL DATA:  Central chest pain for 4 days EXAM: CHEST - 2 VIEW COMPARISON:  04/21/2021 FINDINGS: The heart size and mediastinal contours are within normal limits. Both lungs are clear. The visualized skeletal structures are unremarkable. IMPRESSION: No active cardiopulmonary disease. Electronically Signed   By: 04/23/2021 M.D.   On: 02/10/2022 15:27    Procedures Procedures    Medications Ordered in ED Medications  alum & mag hydroxide-simeth (MAALOX/MYLANTA) 200-200-20 MG/5ML suspension 30 mL (has no administration in time range)    ED Course/  Medical Decision Making/ A&P Clinical Course as of 02/11/22 0903  Thu Feb 10, 2022  2038 Patient had some response to the GI cocktail.  Delta troponin negative.  Will cover with acid medication and recommend Maalox in between meals and at bedtime.  Return instructions discussed [MB]    Clinical Course User Index [MB] Feb 12, 2022, MD                           Medical Decision Making Amount and/or Complexity of Data Reviewed Labs: ordered. Radiology: ordered.  Risk OTC drugs.   This patient complains of upper chest pain; this involves an extensive number of  treatment Options and is a complaint that carries with it a high risk of complications and morbidity. The differential includes ACS, pneumonia, PE, pneumothorax, reflux, musculoskeletal  I ordered, reviewed and interpreted labs, which included CBC normal chemistries normal troponins flat pregnancy test negative I ordered medication GI cocktail, H1 blocker but in her symptoms and reviewed PMP when indicated. I ordered imaging studies which included chest x-ray and I independently    visualized and interpreted imaging which showed no acute findings Previous records obtained and reviewed recent admissions Cardiac monitoring reviewed, normal sinus rhythm Social determinants considered, no significant barriers Critical Interventions: None  After the interventions stated above, I reevaluated the patient and found symptomatically improved Admission and further testing considered, no indications for admission or further work-up at this time.  Will start on antacid return instructions discussed         Final Clinical Impression(s) / ED Diagnoses Final diagnoses:  Nonspecific chest pain    Rx / DC Orders ED Discharge Orders          Ordered    famotidine (PEPCID) 20 MG tablet  2 times daily        02/10/22 2039              Hayden Rasmussen, MD 02/11/22 [phone]

## 2022-02-10 NOTE — ED Triage Notes (Signed)
Central chest pain x 4 days. Describes pain as burning and pressure, radiating to mid upper back and BLUE. Mild nausea. Denies cough, fevers, vomiting.

## 2022-06-09 ENCOUNTER — Ambulatory Visit: Payer: Medicaid Other | Admitting: Obstetrics and Gynecology

## 2022-06-23 ENCOUNTER — Institutional Professional Consult (permissible substitution): Payer: Self-pay | Admitting: Pulmonary Disease

## 2022-07-07 ENCOUNTER — Institutional Professional Consult (permissible substitution): Payer: Self-pay | Admitting: Pulmonary Disease

## 2022-07-21 ENCOUNTER — Ambulatory Visit: Payer: Medicaid Other | Admitting: Obstetrics and Gynecology

## 2022-07-21 NOTE — Progress Notes (Deleted)
    GYNECOLOGY VISIT  Patient name: SNIGDHA HOWSER MRN [id number]  Date of birth: 06-18-1996 Chief Complaint:   No chief complaint on file.   History:  ATHLEEN FELTNER is a 26 y.o. G0P0000 being seen today for ***.    Past Medical History:  Diagnosis Date   Menstrual periods irregular    Seasonal allergies    Vitamin D deficiency     Past Surgical History:  Procedure Laterality Date   ANAL FISSURE REPAIR      The following portions of the patient's history were reviewed and updated as appropriate: allergies, current medications, past family history, past medical history, past social history, past surgical history and problem list.   Health Maintenance:   Last pap 03/2019. Results were: NILM w/ HRHPV not done. H/O abnormal pap: no Last mammogram: ***. Results were: {normal, abnormal, n/a:23837}. Family h/o breast cancer: {yes***/no:23838}   Review of Systems:  {Ros - complete:30496} Comprehensive review of systems was otherwise negative.   Objective:  Physical Exam There were no vitals taken for this visit.   Physical Exam   Labs and Imaging No results found.     Assessment & Plan:   There are no diagnoses linked to this encounter.   *** Routine preventative health maintenance measures emphasized.  Lorriane Shire, MD [facility], [facility]

## 2022-08-16 NOTE — Progress Notes (Deleted)
   ANNUAL EXAM Patient name: Annette Ferguson MRN [id number]  Date of birth: 04/10/1996 Chief Complaint:   No chief complaint on file.  History of Present Illness:   Annette Ferguson is a 27 y.o. G0P0000 being seen today for a routine annual exam.  Current complaints: ***  No LMP recorded. (Menstrual status: Irregular Periods).   The pregnancy intention screening data noted above was reviewed. Potential methods of contraception were discussed. The patient elected to proceed with No data recorded.   Last pap 03/2019. Results were: NILM w/ HRHPV not done.  Last mammogram: n/a Last colonoscopy: n/a      No data to display               No data to display           Review of Systems:   Pertinent items are noted in HPI Denies any headaches, blurred vision, fatigue, shortness of breath, chest pain, abdominal pain, abnormal vaginal discharge/itching/odor/irritation, problems with periods, bowel movements, urination, or intercourse unless otherwise stated above. Pertinent History Reviewed:  Reviewed past medical,surgical, social and family history.  Reviewed problem list, medications and allergies. Physical Assessment:  There were no vitals filed for this visit.There is no height or weight on file to calculate BMI.        Physical Examination:   General appearance - well appearing, and in no distress  Mental status - alert, oriented to person, place, and time  Psych:  She has a normal mood and affect  Skin - warm and dry, normal color, no suspicious lesions noted  Chest - effort normal, all lung fields clear to auscultation bilaterally  Heart - normal rate and regular rhythm  Breasts - breasts appear normal, no suspicious masses, no skin or nipple changes or  axillary nodes  Abdomen - soft, nontender, nondistended, no masses or organomegaly  Pelvic -  VULVA: normal appearing vulva with no masses, tenderness or lesions   VAGINA: normal appearing vagina with normal color  and discharge, no lesions   CERVIX: normal appearing cervix without discharge or lesions, no CMT  Thin prep pap is {Desc; done/not:10129} *** HR HPV cotesting  UTERUS: uterus is felt to be normal size, shape, consistency and nontender   ADNEXA: No adnexal masses or tenderness noted.  Extremities:  No swelling or varicosities noted  Chaperone present for exam  No results found for this or any previous visit (from the past 24 hour(s)).    Assessment & Plan:  There are no diagnoses linked to this encounter.  Labs/procedures today: ***  Mammogram: {Mammo f/u:25212::"@ 27yo"}, or sooner if problems Colonoscopy: {TCS f/u:25213::"@ 27yo"}, or sooner if problems  No orders of the defined types were placed in this encounter.   Meds: No orders of the defined types were placed in this encounter.   Follow-up: No follow-ups on file.  Darliss Cheney, MD 08/16/2022 7:15 PM

## 2022-08-17 ENCOUNTER — Ambulatory Visit: Payer: Medicaid Other | Admitting: Obstetrics and Gynecology

## 2022-10-05 ENCOUNTER — Emergency Department (HOSPITAL_BASED_OUTPATIENT_CLINIC_OR_DEPARTMENT_OTHER): Payer: Medicaid Other

## 2022-10-05 ENCOUNTER — Emergency Department (HOSPITAL_BASED_OUTPATIENT_CLINIC_OR_DEPARTMENT_OTHER)
Admission: EM | Admit: 2022-10-05 | Discharge: 2022-10-05 | Disposition: A | Discharge status: Home / Self Care | Payer: Medicaid Other | Attending: Emergency Medicine | Admitting: Emergency Medicine

## 2022-10-05 ENCOUNTER — Other Ambulatory Visit: Payer: Self-pay

## 2022-10-05 ENCOUNTER — Encounter (HOSPITAL_BASED_OUTPATIENT_CLINIC_OR_DEPARTMENT_OTHER): Payer: Self-pay

## 2022-10-05 DIAGNOSIS — R1084 Generalized abdominal pain: Secondary | ICD-10-CM | POA: Diagnosis not present

## 2022-10-05 DIAGNOSIS — M545 Low back pain, unspecified: Secondary | ICD-10-CM | POA: Diagnosis present

## 2022-10-05 DIAGNOSIS — R0789 Other chest pain: Secondary | ICD-10-CM | POA: Insufficient documentation

## 2022-10-05 LAB — COMPREHENSIVE METABOLIC PANEL
ALT: 29 U/L (ref 0–44)
AST: 24 U/L (ref 15–41)
Albumin: 3.4 g/dL — ABNORMAL LOW (ref 3.5–5.0)
Alkaline Phosphatase: 51 U/L (ref 38–126)
Anion gap: 5 (ref 5–15)
BUN: 9 mg/dL (ref 6–20)
CO2: 26 mmol/L (ref 22–32)
Calcium: 8.6 mg/dL — ABNORMAL LOW (ref 8.9–10.3)
Chloride: 102 mmol/L (ref 98–111)
Creatinine, Ser: 0.65 mg/dL (ref 0.44–1.00)
GFR, Estimated: >60 mL/min (ref >60)
Glucose, Bld: 90 mg/dL (ref 70–99)
Potassium: 3.7 mmol/L (ref 3.5–5.1)
Sodium: 133 mmol/L — ABNORMAL LOW (ref 135–145)
Total Bilirubin: 0.6 mg/dL (ref 0.3–1.2)
Total Protein: 7.9 g/dL (ref 6.5–8.1)

## 2022-10-05 LAB — CBC WITH DIFFERENTIAL/PLATELET
Abs Immature Granulocytes: 0 10*3/uL (ref 0.00–0.07)
Basophils Absolute: 0 10*3/uL (ref 0.0–0.1)
Basophils Relative: 1 %
Eosinophils Absolute: 0.1 10*3/uL (ref 0.0–0.5)
Eosinophils Relative: 1 %
HCT: 37.3 % (ref 36.0–46.0)
Hemoglobin: 12.6 g/dL (ref 12.0–15.0)
Immature Granulocytes: 0 %
Lymphocytes Relative: 28 %
Lymphs Abs: 1.3 10*3/uL (ref 0.7–4.0)
MCH: 28.9 pg (ref 26.0–34.0)
MCHC: 33.8 g/dL (ref 30.0–36.0)
MCV: 85.6 fL (ref 80.0–100.0)
Monocytes Absolute: 0.4 10*3/uL (ref 0.1–1.0)
Monocytes Relative: 9 %
Neutro Abs: 2.8 10*3/uL (ref 1.7–7.7)
Neutrophils Relative %: 61 %
Platelets: 309 10*3/uL (ref 150–400)
RBC: 4.36 MIL/uL (ref 3.87–5.11)
RDW: 13.3 % (ref 11.5–15.5)
WBC: 4.5 10*3/uL (ref 4.0–10.5)
nRBC: 0 % (ref 0.0–0.2)

## 2022-10-05 LAB — URINALYSIS, ROUTINE W REFLEX MICROSCOPIC
Bilirubin Urine: NEGATIVE
Glucose, UA: NEGATIVE mg/dL
Hgb urine dipstick: NEGATIVE
Ketones, ur: NEGATIVE mg/dL
Leukocytes,Ua: NEGATIVE
Nitrite: NEGATIVE
Protein, ur: NEGATIVE mg/dL
Specific Gravity, Urine: 1.02 (ref 1.005–1.030)
pH: 7 (ref 5.0–8.0)

## 2022-10-05 LAB — PREGNANCY, URINE: Preg Test, Ur: NEGATIVE

## 2022-10-05 LAB — LIPASE, BLOOD: Lipase: 29 U/L (ref 11–51)

## 2022-10-05 MED ORDER — ALUM & MAG HYDROXIDE-SIMETH 200-200-20 MG/5ML PO SUSP
30.0000 mL | Freq: Once | ORAL | Status: AC
  Administered 2022-10-05: 30 mL via ORAL
  Filled 2022-10-05: qty 30

## 2022-10-05 MED ORDER — LIDOCAINE VISCOUS HCL 2 % MT SOLN
15.0000 mL | Freq: Once | OROMUCOSAL | Status: AC
  Administered 2022-10-05: 15 mL via ORAL
  Filled 2022-10-05: qty 15

## 2022-10-05 NOTE — ED Provider Notes (Signed)
[place] [facility] Provider Note   CSN: [id number] Arrival date & time: 10/05/22  [id number]     History  Chief Complaint  Patient presents with   Back Pain    Annette Ferguson is a 27 y.o. female.  27 yo F with a chief complaints of right-sided low back pain.  This been going on for about 4 days.  Worse with movement twisting palpation.  This morning she woke up and felt like her stomach was upset.  Diffusely about the abdomen worse on the right side than the left.  She also felt she was having some pain with deep inspiration.  She denies cough congestion or fever denies nausea vomiting or diarrhea.  She has been having increased urinary frequency.  She denies likelihood of being pregnant.   Back Pain      Home Medications Prior to Admission medications   Medication Sig Start Date End Date Taking? Authorizing Provider  cephALEXin (KEFLEX) 500 MG capsule Take 1 capsule (500 mg total) by mouth 2 (two) times daily. 01/21/20   Recardo Evangelist, PA-C  famotidine (PEPCID) 20 MG tablet Take 1 tablet (20 mg total) by mouth 2 (two) times daily. 02/10/22   Hayden Rasmussen, MD  fluticasone (FLONASE) 50 MCG/ACT nasal spray Place 2 sprays into both nostrils daily. 12/28/19   Couture, Cortni S, PA-C  hydrocortisone (ANUSOL-HC) 25 MG suppository Place 1 suppository (25 mg total) rectally 2 (two) times daily. Patient not taking: No sig reported 02/22/19   Isla Pence, MD  loratadine (CLARITIN) 10 MG tablet Take 1 tablet (10 mg total) by mouth daily. 12/28/19 01/27/20  Couture, Cortni S, PA-C  medroxyPROGESTERone (PROVERA) 10 MG tablet Take 1 tablet (10 mg total) by mouth daily. For 10 days Patient not taking: No sig reported 03/14/19   Woodroe Mode, MD  methocarbamol (ROBAXIN) 500 MG tablet Take 1 tablet (500 mg total) by mouth every 8 (eight) hours as needed. Patient not taking: No sig reported 05/17/19   Petrucelli, Samantha R, PA-C  naproxen (NAPROSYN)  500 MG tablet Take 1 tablet (500 mg total) by mouth 2 (two) times daily. Patient not taking: No sig reported 05/17/19   Petrucelli, Samantha R, PA-C  omeprazole (PRILOSEC) 20 MG capsule Take 1 capsule (20 mg total) by mouth daily. 04/21/21   Malvin Johns, MD  Vitamin D, Ergocalciferol, (DRISDOL) 1.25 MG (50000 UT) CAPS capsule Take by mouth.    [provider]      Allergies    Patient has no known allergies.    Review of Systems   Review of Systems  Musculoskeletal:  Positive for back pain.    Physical Exam Updated Vital Signs BP 109/71   Pulse 77   Temp 98 F (36.7 C)   Resp (!) 23   Ht '5\' 3"'$  (1.6 m)   Wt 111.1 kg   LMP 09/14/2022 (Approximate)   SpO2 100%   BMI 43.40 kg/m  Physical Exam Vitals and nursing note reviewed.  Constitutional:      General: She is not in acute distress.    Appearance: She is well-developed. She is not diaphoretic.  HENT:     Head: Normocephalic and atraumatic.  Eyes:     Pupils: Pupils are equal, round, and reactive to light.  Cardiovascular:     Rate and Rhythm: Normal rate and regular rhythm.     Heart sounds: No murmur heard.    No friction rub. No gallop.  Comments: Palpation about the sternum reproduces her chest discomfort Pulmonary:     Effort: Pulmonary effort is normal.     Breath sounds: No wheezing or rales.  Abdominal:     General: There is no distension.     Palpations: Abdomen is soft.     Tenderness: There is no abdominal tenderness.     Comments: Very mild if any abdominal discomfort diffusely without focality  Musculoskeletal:        General: No tenderness.     Cervical back: Normal range of motion and neck supple.     Comments: Mild right low back discomfort about the SI joint.  Skin:    General: Skin is warm and dry.  Neurological:     Mental Status: She is alert and oriented to person, place, and time.  Psychiatric:        Behavior: Behavior normal.     ED Results / Procedures / Treatments    Labs (all labs ordered are listed, but only abnormal results are displayed) Labs Reviewed  COMPREHENSIVE METABOLIC PANEL - Abnormal; Notable for the following components:      Result Value   Sodium 133 (*)    Calcium 8.6 (*)    Albumin 3.4 (*)    All other components within normal limits  URINALYSIS, ROUTINE W REFLEX MICROSCOPIC  PREGNANCY, URINE  CBC WITH DIFFERENTIAL/PLATELET  LIPASE, BLOOD    EKG EKG Interpretation  Date/Time:  Wednesday October 05 2022 09:15:10 EST Ventricular Rate:  74 PR Interval:  185 QRS Duration: 95 QT Interval:  373 QTC Calculation: 414 R Axis:   57 Text Interpretation: Sinus rhythm Borderline T abnormalities, anterior leads No significant change since last tracing Confirmed by Deno Etienne [id number]) on 10/05/2022 9:40:03 AM  Radiology DG Chest Portable 1 View  Result Date: 10/05/2022 CLINICAL DATA:  Chest pain EXAM: PORTABLE CHEST 1 VIEW COMPARISON:  Portable exam 0905 hours compared to 02/10/2022 FINDINGS: Normal heart size, mediastinal contours, and pulmonary vascularity. Lungs clear. No pleural effusion or pneumothorax. Bones unremarkable. IMPRESSION: No acute abnormalities. Electronically Signed   By: Lavonia Dana M.D.   On: 10/05/2022 09:28    Procedures Procedures    Medications Ordered in ED Medications  alum & mag hydroxide-simeth (MAALOX/MYLANTA) 200-200-20 MG/5ML suspension 30 mL (30 mLs Oral Given 10/05/22 0915)    And  lidocaine (XYLOCAINE) 2 % viscous mouth solution 15 mL (15 mLs Oral Given 10/05/22 0915)    ED Course/ Medical Decision Making/ A&P                             Medical Decision Making Amount and/or Complexity of Data Reviewed Labs: ordered. Radiology: ordered.  Risk OTC drugs. Prescription drug management.   27 yo F with no significant past medical history and with now 11 visits to emergency department setting in 4 years comes in with a chief complaints of right-sided low back pain abdominal pain and chest  discomfort.  The back pain and chest pain are most likely musculoskeletal by history and physical exam.  She is also describing some abdominal discomfort.  Vague in nature, mild diffuse discomfort.  Will obtain a laboratory evaluation.  Dose of Maalox.  Chest x-ray and EKG to evaluate for possible life-threatening causes of chest pain.  I feel very unlikely to be ACS or pulmonary embolism based on history and no further workup is warranted.  Patient feeling better on repeat assessment.  Heart rate is  improved significantly with IV fluids.  Will discharge home.  PCP follow-up.  11:43 AM:  I have discussed the diagnosis/risks/treatment options with the patient.  Evaluation and diagnostic testing in the emergency department does not suggest an emergent condition requiring admission or immediate intervention beyond what has been performed at this time.  They will follow up with PCP. We also discussed returning to the ED immediately if new or worsening sx occur. We discussed the sx which are most concerning (e.g., sudden worsening pain, fever, inability to tolerate by mouth) that necessitate immediate return. Medications administered to the patient during their visit and any new prescriptions provided to the patient are listed below.  Medications given during this visit Medications  alum & mag hydroxide-simeth (MAALOX/MYLANTA) 200-200-20 MG/5ML suspension 30 mL (30 mLs Oral Given 10/05/22 0915)    And  lidocaine (XYLOCAINE) 2 % viscous mouth solution 15 mL (15 mLs Oral Given 10/05/22 0915)     The patient appears reasonably screen and/or stabilized for discharge and I doubt any other medical condition or other [facility] requiring further screening, evaluation, or treatment in the ED at this time prior to discharge.          Final Clinical Impression(s) / ED Diagnoses Final diagnoses:  Acute right-sided low back pain without sciatica    Rx / DC Orders ED Discharge Orders     None          Deno Etienne, DO 10/05/22 1143

## 2022-10-05 NOTE — ED Notes (Signed)
Signature pad broken.  Ticket to be placed by Network engineer. Pt states understanding to waiver

## 2022-10-05 NOTE — ED Triage Notes (Signed)
Pt ambulatory to triage. Reports back pain x 4 days with no known injury. Then woke up during the night with sharp abdominal. Had BM during night. Mild nausea No vomiting. Pt states when she takes a deep breath hurts into chest

## 2022-10-05 NOTE — Discharge Instructions (Signed)
Take 4 over the counter ibuprofen tablets 3 times a day or 2 over-the-counter naproxen tablets twice a day for pain. Also take tylenol '1000mg'$ (2 extra strength) four times a day.   Follow up with your doctor in the office.  Return for worsening symptoms, fever, inability to eat or drink

## 2022-10-05 NOTE — ED Notes (Signed)
Reviewed discharge instructions and recommendations with pt. Pt states understanding-

## 2022-11-28 ENCOUNTER — Ambulatory Visit: Payer: Medicaid Other | Admitting: Obstetrics and Gynecology

## 2022-12-23 ENCOUNTER — Encounter (HOSPITAL_BASED_OUTPATIENT_CLINIC_OR_DEPARTMENT_OTHER): Payer: Self-pay

## 2022-12-23 ENCOUNTER — Emergency Department (HOSPITAL_BASED_OUTPATIENT_CLINIC_OR_DEPARTMENT_OTHER): Payer: Medicaid Other

## 2022-12-23 ENCOUNTER — Other Ambulatory Visit: Payer: Self-pay

## 2022-12-23 ENCOUNTER — Emergency Department (HOSPITAL_BASED_OUTPATIENT_CLINIC_OR_DEPARTMENT_OTHER)
Admission: EM | Admit: 2022-12-23 | Discharge: 2022-12-23 | Disposition: A | Discharge status: Home / Self Care | Payer: Medicaid Other | Attending: Emergency Medicine | Admitting: Emergency Medicine

## 2022-12-23 DIAGNOSIS — R109 Unspecified abdominal pain: Secondary | ICD-10-CM | POA: Insufficient documentation

## 2022-12-23 DIAGNOSIS — R079 Chest pain, unspecified: Secondary | ICD-10-CM | POA: Diagnosis present

## 2022-12-23 DIAGNOSIS — R0602 Shortness of breath: Secondary | ICD-10-CM | POA: Diagnosis not present

## 2022-12-23 LAB — COMPREHENSIVE METABOLIC PANEL
ALT: 29 U/L (ref 0–44)
AST: 26 U/L (ref 15–41)
Albumin: 3.4 g/dL — ABNORMAL LOW (ref 3.5–5.0)
Alkaline Phosphatase: 39 U/L (ref 38–126)
Anion gap: 8 (ref 5–15)
BUN: 6 mg/dL (ref 6–20)
CO2: 24 mmol/L (ref 22–32)
Calcium: 9.3 mg/dL (ref 8.9–10.3)
Chloride: 105 mmol/L (ref 98–111)
Creatinine, Ser: 0.58 mg/dL (ref 0.44–1.00)
GFR, Estimated: >60 mL/min (ref >60)
Glucose, Bld: 92 mg/dL (ref 70–99)
Potassium: 3.5 mmol/L (ref 3.5–5.1)
Sodium: 137 mmol/L (ref 135–145)
Total Bilirubin: 0.2 mg/dL — ABNORMAL LOW (ref 0.3–1.2)
Total Protein: 7.2 g/dL (ref 6.5–8.1)

## 2022-12-23 LAB — URINALYSIS, W/ REFLEX TO CULTURE (INFECTION SUSPECTED)
Bilirubin Urine: NEGATIVE
Glucose, UA: NEGATIVE mg/dL
Hgb urine dipstick: NEGATIVE
Ketones, ur: NEGATIVE mg/dL
Leukocytes,Ua: NEGATIVE
Nitrite: NEGATIVE
Protein, ur: NEGATIVE mg/dL
RBC / HPF: NONE SEEN RBC/hpf (ref 0–5)
Specific Gravity, Urine: 1.025 (ref 1.005–1.030)
WBC, UA: NONE SEEN WBC/hpf (ref 0–5)
pH: 6 (ref 5.0–8.0)

## 2022-12-23 LAB — CBC WITH DIFFERENTIAL/PLATELET
Abs Immature Granulocytes: 0.01 10*3/uL (ref 0.00–0.07)
Basophils Absolute: 0 10*3/uL (ref 0.0–0.1)
Basophils Relative: 0 %
Eosinophils Absolute: 0.1 10*3/uL (ref 0.0–0.5)
Eosinophils Relative: 2 %
HCT: 35.1 % — ABNORMAL LOW (ref 36.0–46.0)
Hemoglobin: 11.4 g/dL — ABNORMAL LOW (ref 12.0–15.0)
Immature Granulocytes: 0 %
Lymphocytes Relative: 39 %
Lymphs Abs: 1.8 10*3/uL (ref 0.7–4.0)
MCH: 28.1 pg (ref 26.0–34.0)
MCHC: 32.5 g/dL (ref 30.0–36.0)
MCV: 86.7 fL (ref 80.0–100.0)
Monocytes Absolute: 0.5 10*3/uL (ref 0.1–1.0)
Monocytes Relative: 10 %
Neutro Abs: 2.3 10*3/uL (ref 1.7–7.7)
Neutrophils Relative %: 49 %
Platelets: 325 10*3/uL (ref 150–400)
RBC: 4.05 MIL/uL (ref 3.87–5.11)
RDW: 13 % (ref 11.5–15.5)
WBC: 4.7 10*3/uL (ref 4.0–10.5)
nRBC: 0 % (ref 0.0–0.2)

## 2022-12-23 LAB — PREGNANCY, URINE: Preg Test, Ur: NEGATIVE

## 2022-12-23 LAB — TROPONIN I (HIGH SENSITIVITY)
Troponin I (High Sensitivity): <2 ng/L (ref <18)
Troponin I (High Sensitivity): <2 ng/L (ref <18)

## 2022-12-23 LAB — LIPASE, BLOOD: Lipase: 31 U/L (ref 11–51)

## 2022-12-23 MED ORDER — FAMOTIDINE 20 MG PO TABS: 20.0000 mg | ORAL_TABLET | Freq: Two times a day (BID) | ORAL | 0 refills | Status: AC

## 2022-12-23 MED ORDER — ALUM & MAG HYDROXIDE-SIMETH 200-200-20 MG/5ML PO SUSP
30.0000 mL | Freq: Once | ORAL | Status: AC
  Administered 2022-12-23: 30 mL via ORAL
  Filled 2022-12-23: qty 30

## 2022-12-23 MED ORDER — PANTOPRAZOLE SODIUM 40 MG IV SOLR
40.0000 mg | Freq: Once | INTRAVENOUS | Status: AC
  Administered 2022-12-23: 40 mg via INTRAVENOUS
  Filled 2022-12-23: qty 10

## 2022-12-23 MED ORDER — IOHEXOL 300 MG/ML  SOLN
100.0000 mL | Freq: Once | INTRAMUSCULAR | Status: AC | PRN
  Administered 2022-12-23: 100 mL via INTRAVENOUS

## 2022-12-23 MED ORDER — PANTOPRAZOLE SODIUM 40 MG PO TBEC: 40.0000 mg | DELAYED_RELEASE_TABLET | Freq: Every day | ORAL | 0 refills | Status: DC

## 2022-12-23 NOTE — Discharge Instructions (Signed)
If you develop recurrent, continued, or worsening chest pain, shortness of breath, fever, vomiting, abdominal or back pain, or any other new/concerning symptoms then return to the ER for evaluation.  

## 2022-12-23 NOTE — ED Provider Notes (Signed)
[place] [facility] Provider Note   CSN: [id number] Arrival date & time: 12/23/22  [id number]     History  Chief Complaint  Patient presents with   Chest Pain    Annette Ferguson is a 27 y.o. female.  HPI 27 year old female presents with abdominal and chest pain.  The abdominal pain started yesterday morning and has been present all day.  Chest pain seem to start last night but got a lot worse about 30 minutes prior to arrival.  Chest pain feels like a pressure and a burning.  Her abdomen hurts diffusely.  She tried some Tums but it did not seem to help.  She has had symptoms like this before though it has been a while.  She does not regularly take any medications for her stomach.  No fevers, cough.  She does feel short of breath.  No leg swelling.  Home Medications Prior to Admission medications   Medication Sig Start Date End Date Taking? Authorizing Provider  albuterol (VENTOLIN HFA) 108 (90 Base) MCG/ACT inhaler Inhale 2 puffs into the lungs as needed for wheezing or shortness of breath. 08/05/22  Yes [provider]  azelastine (ASTELIN) 0.1 % nasal spray Place 1 spray into both nostrils as needed for rhinitis or allergies. 11/08/22  Yes [provider]  cetirizine (ZYRTEC) 10 MG tablet Take 10 mg by mouth daily. 11/08/22  Yes [provider]  famotidine (PEPCID) 20 MG tablet Take 1 tablet (20 mg total) by mouth 2 (two) times daily. 12/23/22  Yes Pricilla Loveless, MD  pantoprazole (PROTONIX) 40 MG tablet Take 1 tablet (40 mg total) by mouth daily. 12/23/22  Yes Pricilla Loveless, MD  cephALEXin (KEFLEX) 500 MG capsule Take 1 capsule (500 mg total) by mouth 2 (two) times daily. Patient not taking: Reported on 12/23/2022 01/21/20   Bethel Born, PA-C  fluticasone [facility]) 50 MCG/ACT nasal spray Place 2 sprays into both nostrils daily. Patient not taking: Reported on 12/23/2022 12/28/19   Couture, Cortni S, PA-C  hydrocortisone  (ANUSOL-HC) 25 MG suppository Place 1 suppository (25 mg total) rectally 2 (two) times daily. Patient not taking: Reported on 07/01/2019 02/22/19   Jacalyn Lefevre, MD  loratadine (CLARITIN) 10 MG tablet Take 1 tablet (10 mg total) by mouth daily. Patient not taking: Reported on 12/23/2022 12/28/19 12/23/22  Couture, Cortni S, PA-C  medroxyPROGESTERone (PROVERA) 10 MG tablet Take 1 tablet (10 mg total) by mouth daily. For 10 days Patient not taking: Reported on 05/08/2019 03/14/19   Adam Phenix, MD  methocarbamol (ROBAXIN) 500 MG tablet Take 1 tablet (500 mg total) by mouth every 8 (eight) hours as needed. Patient not taking: Reported on 07/01/2019 05/17/19   Petrucelli, Lelon Mast R, PA-C  naproxen (NAPROSYN) 500 MG tablet Take 1 tablet (500 mg total) by mouth 2 (two) times daily. Patient not taking: Reported on 07/01/2019 05/17/19   Petrucelli, Pleas Koch, PA-C      Allergies    Patient has no known allergies.    Review of Systems   Review of Systems  Constitutional:  Negative for fever.  Respiratory:  Positive for shortness of breath. Negative for cough.   Cardiovascular:  Positive for chest pain.  Gastrointestinal:  Positive for abdominal pain.    Physical Exam Updated Vital Signs BP 121/77   Pulse 78   Temp 98.1 F (36.7 C) (Oral)   Resp (!) 21   Ht 5\' 3"  (1.6 m)   Wt 112.5 kg  SpO2 100%   BMI 43.93 kg/m  Physical Exam Vitals and nursing note reviewed.  Constitutional:      General: She is not in acute distress.    Appearance: She is well-developed. She is obese. She is not ill-appearing or diaphoretic.  HENT:     Head: Normocephalic and atraumatic.  Cardiovascular:     Rate and Rhythm: Normal rate and regular rhythm.     Heart sounds: Normal heart sounds.  Pulmonary:     Effort: Pulmonary effort is normal.     Breath sounds: Normal breath sounds.  Abdominal:     Palpations: Abdomen is soft.     Tenderness: There is generalized abdominal tenderness.  Skin:     General: Skin is warm and dry.  Neurological:     Mental Status: She is alert.     ED Results / Procedures / Treatments   Labs (all labs ordered are listed, but only abnormal results are displayed) Labs Reviewed  URINALYSIS, W/ REFLEX TO CULTURE (INFECTION SUSPECTED) - Abnormal; Notable for the following components:      Result Value   Bacteria, UA RARE (*)    All other components within normal limits  COMPREHENSIVE METABOLIC PANEL - Abnormal; Notable for the following components:   Albumin 3.4 (*)    Total Bilirubin 0.2 (*)    All other components within normal limits  CBC WITH DIFFERENTIAL/PLATELET - Abnormal; Notable for the following components:   Hemoglobin 11.4 (*)    HCT 35.1 (*)    All other components within normal limits  PREGNANCY, URINE  LIPASE, BLOOD  TROPONIN I (HIGH SENSITIVITY)  TROPONIN I (HIGH SENSITIVITY)    EKG EKG Interpretation  Date/Time:  Friday Dec 23 2022 08:22:53 EDT Ventricular Rate:  74 PR Interval:  183 QRS Duration: 89 QT Interval:  432 QTC Calculation: 480 R Axis:   53 Text Interpretation: Sinus rhythm Borderline T abnormalities, diffuse leads Borderline prolonged QT interval T wave changes similar to Feb 2024 Confirmed by Pricilla Loveless [phone]) on 12/23/2022 8:29:02 AM  Radiology CT ABDOMEN PELVIS W CONTRAST  Result Date: 12/23/2022 CLINICAL DATA:  Acute abdominal pain EXAM: CT ABDOMEN AND PELVIS WITH CONTRAST TECHNIQUE: Multidetector CT imaging of the abdomen and pelvis was performed using the standard protocol following bolus administration of intravenous contrast. RADIATION DOSE REDUCTION: This exam was performed according to the departmental dose-optimization program which includes automated exposure control, adjustment of the mA and/or kV according to patient size and/or use of iterative reconstruction technique. CONTRAST:  OMNIPAQUE IOHEXOL 300 MG/ML  SOLN COMPARISON:  None Available. FINDINGS: Lower chest: No acute abnormality.  Hepatobiliary: No focal liver abnormality is seen. No gallstones, gallbladder wall thickening, or biliary dilatation. Pancreas: Unremarkable. No pancreatic ductal dilatation or surrounding inflammatory changes. Spleen: Normal in size without focal abnormality. Adrenals/Urinary Tract: Adrenal glands are unremarkable. Kidneys are normal, without renal calculi, focal lesion, or hydronephrosis. Bladder is unremarkable. Stomach/Bowel: Stomach is within normal limits. Appendix appears normal. No evidence of bowel wall thickening, distention, or inflammatory changes. Vascular/Lymphatic: No significant vascular findings are present. No enlarged abdominal or pelvic lymph nodes. Reproductive: Uterus and bilateral adnexa are unremarkable. Other: No abdominal wall hernia or abnormality. No abdominopelvic ascites. Musculoskeletal: No acute or significant osseous findings. IMPRESSION: No CT evidence of acute abdominal/pelvic process. Electronically Signed   By: Larose Hires D.O.   On: 12/23/2022 10:33   DG Chest 2 View  Result Date: 12/23/2022 CLINICAL DATA:  Chest pain EXAM: CHEST - 2 VIEW COMPARISON:  CXR 10/05/22 FINDINGS: No pleural effusion. No pneumothorax. No focal airspace opacity. Normal cardiac and mediastinal contours. No radiographically apparent displaced rib fracture. Vertebral body heights are maintained. IMPRESSION: No focal airspace opacity. Electronically Signed   By: Lorenza Cambridge M.D.   On: 12/23/2022 09:05    Procedures Procedures    Medications Ordered in ED Medications  pantoprazole (PROTONIX) injection 40 mg (40 mg Intravenous Given 12/23/22 0847)  alum & mag hydroxide-simeth (MAALOX/MYLANTA) 200-200-20 MG/5ML suspension 30 mL (30 mLs Oral Given 12/23/22 0848)  iohexol (OMNIPAQUE) 300 MG/ML solution 100 mL (100 mLs Intravenous Contrast Given 12/23/22 1007)    ED Course/ Medical Decision Making/ A&P                             Medical Decision Making Amount and/or Complexity of Data  Reviewed Labs: ordered.    Details: Troponins negative x 2.  UA overall unremarkable.  Other labs unremarkable besides chronic anemia. Radiology: ordered and independent interpretation performed.    Details: Chest x-ray without pneumonia.  CT without acute pathology. ECG/medicine tests: ordered and independent interpretation performed.    Details: No ischemia  Risk OTC drugs. Prescription drug management.   Patient presents with chest/abdominal pain.  Has diffuse abdominal tenderness.  No clear cause at this time and CT is overall unremarkable.  Labs reassuring.  Low suspicion for ACS, PE, dissection.  Overall unremarkable vitals.  Does feel somewhat better.  Has had previous history of GERD presenting with chest pain and some abdominal discomfort though this time it was more diffuse.  At this point, I think she is stable for discharge to follow-up with PCP.  Will give Pepcid and Protonix prescriptions and advised follow-up and give return precautions.        Final Clinical Impression(s) / ED Diagnoses Final diagnoses:  Nonspecific chest pain    Rx / DC Orders ED Discharge Orders          Ordered    pantoprazole (PROTONIX) 40 MG tablet  Daily        12/23/22 1153    famotidine (PEPCID) 20 MG tablet  2 times daily        12/23/22 1153              Pricilla Loveless, MD 12/23/22 1207

## 2022-12-23 NOTE — ED Triage Notes (Signed)
Patient having chest pain and shortness of breath since last night.

## 2023-01-06 ENCOUNTER — Ambulatory Visit: Payer: Medicaid Other | Admitting: Obstetrics & Gynecology

## 2023-06-28 ENCOUNTER — Encounter: Payer: Medicaid Other | Admitting: Obstetrics & Gynecology

## 2023-07-03 ENCOUNTER — Encounter: Payer: Medicaid Other | Admitting: Obstetrics & Gynecology

## 2023-08-17 ENCOUNTER — Encounter: Payer: Medicaid Other | Admitting: Obstetrics & Gynecology

## 2023-11-29 ENCOUNTER — Ambulatory Visit: Payer: Medicaid Other | Admitting: Obstetrics & Gynecology

## 2023-11-29 ENCOUNTER — Other Ambulatory Visit: Payer: Self-pay

## 2023-11-29 ENCOUNTER — Other Ambulatory Visit (HOSPITAL_COMMUNITY)
Admission: RE | Admit: 2023-11-29 | Discharge: 2023-11-29 | Disposition: A | Discharge status: Home / Self Care | Source: Ambulatory Visit | Attending: Family Medicine | Admitting: Family Medicine

## 2023-11-29 VITALS — BP 126/76 | HR 92 | Wt 261.0 lb

## 2023-11-29 DIAGNOSIS — Z124 Encounter for screening for malignant neoplasm of cervix: Secondary | ICD-10-CM

## 2023-11-29 DIAGNOSIS — N970 Female infertility associated with anovulation: Secondary | ICD-10-CM | POA: Diagnosis not present

## 2023-11-29 MED ORDER — DROSPIRENONE-ETHINYL ESTRADIOL 3-0.02 MG PO TABS: 1.0000 | ORAL_TABLET | Freq: Every day | ORAL | 11 refills | Status: AC

## 2023-11-29 NOTE — Addendum Note (Signed)
 Addended byClarence Croak on: 11/29/2023 05:20 PM   Modules accepted: Orders

## 2023-11-29 NOTE — Progress Notes (Signed)
 GYNECOLOGY CLINIC ANNUAL PREVENTATIVE CARE ENCOUNTER NOTE  Subjective:   Annette Ferguson is a 28 y.o. G0P0000 female here for a routine annual gynecologic exam.  Current complaints: no cycle for 9 month, no sexual activity.   Denies abnormal vaginal bleeding, discharge, pelvic pain, problems with intercourse or other gynecologic concerns.   She has h/o chronic anovulation, no hirsutism  Gynecologic History No LMP recorded (lmp unknown). (Menstrual status: Irregular Periods). Contraception: abstinence Last Pap: 2020. Results were: normal   Obstetric History OB History  Gravida Para Term Preterm AB Living  0 0 0 0 0 0  SAB IAB Ectopic Multiple Live Births  0 0 0 0 0    Past Medical History:  Diagnosis Date   Menstrual periods irregular    Seasonal allergies    Vitamin D deficiency     Past Surgical History:  Procedure Laterality Date   ANAL FISSURE REPAIR      Current Outpatient Medications on File Prior to Visit  Medication Sig Dispense Refill   albuterol  (VENTOLIN  HFA) 108 (90 Base) MCG/ACT inhaler Inhale 2 puffs into the lungs as needed for wheezing or shortness of breath.     cetirizine  (ZYRTEC ) 10 MG tablet Take 10 mg by mouth daily.     azelastine (ASTELIN) 0.1 % nasal spray Place 1 spray into both nostrils as needed for rhinitis or allergies.     cephALEXin  (KEFLEX ) 500 MG capsule Take 1 capsule (500 mg total) by mouth 2 (two) times daily. (Patient not taking: Reported on 12/23/2022) 10 capsule 0   famotidine  (PEPCID ) 20 MG tablet Take 1 tablet (20 mg total) by mouth 2 (two) times daily. 30 tablet 0   fluticasone  (FLONASE ) 50 MCG/ACT nasal spray Place 2 sprays into both nostrils daily. (Patient not taking: Reported on 12/23/2022) 16 g 0   hydrocortisone  (ANUSOL -HC) 25 MG suppository Place 1 suppository (25 mg total) rectally 2 (two) times daily. (Patient not taking: Reported on 07/01/2019) 12 suppository 0   loratadine  (CLARITIN ) 10 MG tablet Take 1 tablet (10 mg total)  by mouth daily. (Patient not taking: Reported on 12/23/2022) 30 tablet 0   medroxyPROGESTERone  (PROVERA ) 10 MG tablet Take 1 tablet (10 mg total) by mouth daily. For 10 days (Patient not taking: Reported on 05/08/2019) 10 tablet 0   methocarbamol  (ROBAXIN ) 500 MG tablet Take 1 tablet (500 mg total) by mouth every 8 (eight) hours as needed. (Patient not taking: Reported on 07/01/2019) 15 tablet 0   naproxen  (NAPROSYN ) 500 MG tablet Take 1 tablet (500 mg total) by mouth 2 (two) times daily. (Patient not taking: Reported on 07/01/2019) 10 tablet 0   pantoprazole  (PROTONIX ) 40 MG tablet Take 1 tablet (40 mg total) by mouth daily. 30 tablet 0   No current facility-administered medications on file prior to visit.    No Known Allergies  Social History   Socioeconomic History   Marital status: Single    Spouse name: Not on file   Number of children: Not on file   Years of education: Not on file   Highest education level: Associate degree: academic program  Occupational History   Not on file  Tobacco Use   Smoking status: Never   Smokeless tobacco: Never  Vaping Use   Vaping status: Never Used  Substance and Sexual Activity   Alcohol use: No   Drug use: No   Sexual activity: Never    Birth control/protection: Abstinence, None  Other Topics Concern   Not on file  Social  History Narrative   Not on file   Social Drivers of Health   Financial Resource Strain: Patient Declined (11/29/2023)   Overall Financial Resource Strain (CARDIA)    Difficulty of Paying Living Expenses: Patient declined  Food Insecurity: No Food Insecurity (11/29/2023)   Hunger Vital Sign    Worried About Running Out of Food in the Last Year: Never true    Ran Out of Food in the Last Year: Never true  Transportation Needs: No Transportation Needs (11/29/2023)   PRAPARE - Administrator, Civil Service (Medical): No    Lack of Transportation (Non-Medical): No  Physical Activity: Sufficiently Active  (11/29/2023)   Exercise Vital Sign    Days of Exercise per Week: 5 days    Minutes of Exercise per Session: 30 min  Stress: No Stress Concern Present (11/29/2023)   [employer] of Occupational Health - Occupational Stress Questionnaire    Feeling of Stress : Not at all  Social Connections: Unknown (11/29/2023)   Social Connection and Isolation Panel [NHANES]    Frequency of Communication with Friends and Family: More than three times a week    Frequency of Social Gatherings with Friends and Family: More than three times a week    Attends Religious Services: More than 4 times per year    Active Member of [employer] or Organizations: No    Attends Engineer, structural: Not on file    Marital Status: Patient declined  Intimate Partner Violence: Unknown (11/11/2021)   Received from [employer], [facility]   HITS    Physically Hurt: Not on file    Insult or Talk Down To: Not on file    Threaten Physical Harm: Not on file    Scream or Curse: Not on file    Family History  Problem Relation Age of Onset   Diabetes Father     The following portions of the patient's history were reviewed and updated as appropriate: allergies, current medications, past family history, past medical history, past social history, past surgical history and problem list.  Review of Systems Pertinent items are noted in HPI.   Objective:  BP 126/76   Pulse 92   Wt 261 lb (118.4 kg)   LMP  (LMP Unknown)   BMI 46.23 kg/m  CONSTITUTIONAL: Well-developed, well-nourished female in no acute distress.  HENT:  Normocephalic, atraumatic, External right and left ear normal. Oropharynx is clear and moist EYES: Conjunctivae and EOM are normal. Pupils are equal, round, and reactive to light. No scleral icterus.  NECK: Normal range of motion, supple, no masses.  Normal thyroid.  SKIN: Skin is warm and dry. No rash noted. Not diaphoretic. No erythema. No pallor. NEUROLGIC: Alert and oriented to person, place,  and time. Normal reflexes, muscle tone coordination. No cranial nerve deficit noted. PSYCHIATRIC: Normal mood and affect. Normal behavior. Normal judgment and thought content. CARDIOVASCULAR: Normal heart rate noted, regular rhythm RESPIRATORY:  Effort and breath sounds normal, no problems with respiration noted.  ABDOMEN: Soft, normal bowel sounds, no distention noted.  No tenderness, rebound or guarding.  PELVIC: Normal appearing external genitalia; normal appearing vaginal mucosa and cervix.  No abnormal discharge noted.  Pap smear obtained.  Normal uterine size, no other palpable masses, no uterine or adnexal tenderness. MUSCULOSKELETAL: Normal range of motion. No tenderness.  No cyanosis, clubbing, or edema.  2+ distal pulses.   Assessment:  gynecologic examination with pap smear  Encounter for Papanicolaou smear for cervical cancer screening -  Plan: Cytology - PAP( [place]), RPR+HBsAg+HCVAb+...  Anovulation - Plan: FSH/LH, TestT+TestF+SHBG, Prolactin, TSH Rfx on Abnormal to Free T4, DHEA-sulfate, drospirenone -ethinyl estradiol  (YAZ) 3-0.02 MG tablet  Plan:  Will follow up results of pap smear and manage accordingly. Meds ordered this encounter  Medications   drospirenone -ethinyl estradiol  (YAZ) 3-0.02 MG tablet    Sig: Take 1 tablet by mouth daily.    Dispense:  28 tablet    Refill:  11  RTC 4 months  Routine preventative health maintenance measures emphasized. Please refer to After Visit Summary for other counseling recommendations.    Onnie Bilis, MD Attending Obstetrician & Gynecologist Center for [employer], [facility]

## 2023-11-30 LAB — RPR+HBSAG+HCVAB+...
HIV Screen 4th Generation wRfx: NONREACTIVE
Hep C Virus Ab: NONREACTIVE
Hepatitis B Surface Ag: NEGATIVE
RPR Ser Ql: NONREACTIVE

## 2023-11-30 LAB — TSH RFX ON ABNORMAL TO FREE T4: TSH: 1.51 u[IU]/mL (ref 0.450–4.500)

## 2023-12-01 LAB — CYTOLOGY - PAP
Chlamydia: NEGATIVE
Comment: NEGATIVE
Comment: NEGATIVE
Comment: NEGATIVE
Comment: NORMAL
Diagnosis: NEGATIVE
High risk HPV: NEGATIVE
Neisseria Gonorrhea: NEGATIVE
Trichomonas: NEGATIVE

## 2023-12-02 LAB — DHEA-SULFATE: DHEA-SO4: 217 ug/dL (ref 84.8–378.0)

## 2023-12-02 LAB — PROLACTIN: Prolactin: 12.8 ng/mL (ref 4.8–33.4)

## 2023-12-02 LAB — TESTT+TESTF+SHBG
Sex Hormone Binding: 14.9 nmol/L — ABNORMAL LOW (ref 24.6–122.0)
Testosterone, Free: 1.8 pg/mL (ref 0.0–4.2)
Testosterone, Total, LC/MS: 21.3 ng/dL (ref 10.0–55.0)

## 2023-12-02 LAB — FSH/LH
FSH: 7 m[IU]/mL
LH: 11.4 m[IU]/mL

## 2023-12-07 ENCOUNTER — Ambulatory Visit (HOSPITAL_COMMUNITY): Admission: RE | Admit: 2023-12-07 | Source: Ambulatory Visit

## 2023-12-08 ENCOUNTER — Ambulatory Visit (HOSPITAL_COMMUNITY)
Admission: RE | Admit: 2023-12-08 | Discharge: 2023-12-08 | Disposition: A | Discharge status: Home / Self Care | Source: Ambulatory Visit | Attending: Obstetrics & Gynecology | Admitting: Obstetrics & Gynecology

## 2023-12-08 DIAGNOSIS — N970 Female infertility associated with anovulation: Secondary | ICD-10-CM | POA: Diagnosis present

## 2023-12-10 ENCOUNTER — Encounter: Payer: Self-pay | Admitting: Obstetrics & Gynecology

## 2023-12-18 ENCOUNTER — Encounter: Payer: Self-pay | Admitting: Obstetrics & Gynecology

## 2024-04-28 ENCOUNTER — Encounter (HOSPITAL_BASED_OUTPATIENT_CLINIC_OR_DEPARTMENT_OTHER): Payer: Self-pay | Admitting: Emergency Medicine

## 2024-04-28 ENCOUNTER — Emergency Department (HOSPITAL_BASED_OUTPATIENT_CLINIC_OR_DEPARTMENT_OTHER)
Admission: EM | Admit: 2024-04-28 | Discharge: 2024-04-28 | Disposition: A | Discharge status: Home / Self Care | Source: Ambulatory Visit | Attending: Emergency Medicine | Admitting: Emergency Medicine

## 2024-04-28 ENCOUNTER — Emergency Department (HOSPITAL_BASED_OUTPATIENT_CLINIC_OR_DEPARTMENT_OTHER)

## 2024-04-28 ENCOUNTER — Other Ambulatory Visit: Payer: Self-pay

## 2024-04-28 DIAGNOSIS — R0789 Other chest pain: Secondary | ICD-10-CM | POA: Diagnosis present

## 2024-04-28 DIAGNOSIS — R0602 Shortness of breath: Secondary | ICD-10-CM | POA: Insufficient documentation

## 2024-04-28 LAB — CBC WITH DIFFERENTIAL/PLATELET
Abs Immature Granulocytes: 0.02 K/uL (ref 0.00–0.07)
Basophils Absolute: 0 K/uL (ref 0.0–0.1)
Basophils Relative: 0 %
Eosinophils Absolute: 0.1 K/uL (ref 0.0–0.5)
Eosinophils Relative: 1 %
HCT: 39.9 % (ref 36.0–46.0)
Hemoglobin: 13 g/dL (ref 12.0–15.0)
Immature Granulocytes: 0 %
Lymphocytes Relative: 29 %
Lymphs Abs: 2 K/uL (ref 0.7–4.0)
MCH: 27.8 pg (ref 26.0–34.0)
MCHC: 32.6 g/dL (ref 30.0–36.0)
MCV: 85.4 fL (ref 80.0–100.0)
Monocytes Absolute: 0.5 K/uL (ref 0.1–1.0)
Monocytes Relative: 7 %
Neutro Abs: 4.3 K/uL (ref 1.7–7.7)
Neutrophils Relative %: 63 %
Platelets: 351 K/uL (ref 150–400)
RBC: 4.67 MIL/uL (ref 3.87–5.11)
RDW: 13.4 % (ref 11.5–15.5)
WBC: 6.9 K/uL (ref 4.0–10.5)
nRBC: 0 % (ref 0.0–0.2)

## 2024-04-28 LAB — RESP PANEL BY RT-PCR (RSV, FLU A&B, COVID)  RVPGX2
Influenza A by PCR: NEGATIVE
Influenza B by PCR: NEGATIVE
Resp Syncytial Virus by PCR: NEGATIVE
SARS Coronavirus 2 by RT PCR: NEGATIVE

## 2024-04-28 LAB — COMPREHENSIVE METABOLIC PANEL WITH GFR
ALT: 22 U/L (ref 0–44)
AST: 20 U/L (ref 15–41)
Albumin: 4.3 g/dL (ref 3.5–5.0)
Alkaline Phosphatase: 54 U/L (ref 38–126)
Anion gap: 14 (ref 5–15)
BUN: 11 mg/dL (ref 6–20)
CO2: 22 mmol/L (ref 22–32)
Calcium: 9.6 mg/dL (ref 8.9–10.3)
Chloride: 102 mmol/L (ref 98–111)
Creatinine, Ser: 0.71 mg/dL (ref 0.44–1.00)
GFR, Estimated: >60 mL/min (ref >60)
Glucose, Bld: 89 mg/dL (ref 70–99)
Potassium: 3.9 mmol/L (ref 3.5–5.1)
Sodium: 138 mmol/L (ref 135–145)
Total Bilirubin: 0.3 mg/dL (ref 0.0–1.2)
Total Protein: 8 g/dL (ref 6.5–8.1)

## 2024-04-28 LAB — LIPID PANEL
Cholesterol: 180 mg/dL (ref 0–200)
HDL: 48 mg/dL (ref >40)
LDL Cholesterol: 110 mg/dL — ABNORMAL HIGH (ref 0–99)
Total CHOL/HDL Ratio: 3.8 ratio
Triglycerides: 111 mg/dL (ref <150)
VLDL: 22 mg/dL (ref 0–40)

## 2024-04-28 LAB — HCG, SERUM, QUALITATIVE: Preg, Serum: NEGATIVE

## 2024-04-28 LAB — TROPONIN T, HIGH SENSITIVITY
Troponin T High Sensitivity: <15 ng/L (ref 0–19)
Troponin T High Sensitivity: <15 ng/L (ref 0–19)

## 2024-04-28 LAB — D-DIMER, QUANTITATIVE: D-Dimer, Quant: 0.42 ug{FEU}/mL (ref 0.00–0.50)

## 2024-04-28 MED ORDER — KETOROLAC TROMETHAMINE 30 MG/ML IJ SOLN
30.0000 mg | Freq: Once | INTRAMUSCULAR | Status: AC
  Administered 2024-04-28: 30 mg via INTRAVENOUS
  Filled 2024-04-28: qty 1

## 2024-04-28 MED ORDER — NAPROXEN 500 MG PO TABS: 500.0000 mg | ORAL_TABLET | Freq: Two times a day (BID) | ORAL | 0 refills | Status: AC

## 2024-04-28 NOTE — ED Triage Notes (Signed)
 Pt c/o CP x 3d - describes as burning and pressure with radiation to back ; reports SHOB that started back up today; was seen at [facility] and told her EKG was abnormal, referred here; RT in to assess

## 2024-04-28 NOTE — ED Notes (Signed)

## 2024-04-28 NOTE — ED Provider Notes (Signed)
 [place] [facility] Provider Note   CSN: [id number] Arrival date & time: 04/28/24  1706     Patient presents with: Chest Pain and Shortness of Breath   Annette Ferguson is a 28 y.o. female.  Patient with past history significant for amenorrhea, seasonal allergies presents the emergency department with concerns of chest pain shortness of breath.  Reports 3 days of a burning type pain in her chest as well as pressure with radiation towards her back with some associated shortness of breath that started today.  She has been seen at urgent care and was told to come to the emergency department due to concerns of an abnormal EKG.  Denies any prior cardiac abnormalities.   Chest Pain Associated symptoms: shortness of breath   Shortness of Breath Associated symptoms: chest pain        Prior to Admission medications   Medication Sig Start Date End Date Taking? Authorizing Provider  naproxen  (NAPROSYN ) 500 MG tablet Take 1 tablet (500 mg total) by mouth 2 (two) times daily. 04/28/24  Yes Balin Vandegrift A, PA-C  albuterol  (VENTOLIN  HFA) 108 (90 Base) MCG/ACT inhaler Inhale 2 puffs into the lungs as needed for wheezing or shortness of breath. 08/05/22   [provider]  azelastine (ASTELIN) 0.1 % nasal spray Place 1 spray into both nostrils as needed for rhinitis or allergies. 11/08/22   [provider]  cephALEXin  (KEFLEX ) 500 MG capsule Take 1 capsule (500 mg total) by mouth 2 (two) times daily. Patient not taking: Reported on 12/23/2022 01/21/20   Odis Burnard Jansky, PA-C  cetirizine  (ZYRTEC ) 10 MG tablet Take 10 mg by mouth daily. 11/08/22   [provider]  drospirenone -ethinyl estradiol  (YAZ) 3-0.02 MG tablet Take 1 tablet by mouth daily. 11/29/23   Eveline Lynwood KANDICE, MD  famotidine  (PEPCID ) 20 MG tablet Take 1 tablet (20 mg total) by mouth 2 (two) times daily. 12/23/22   Freddi Hamilton, MD  fluticasone  (FLONASE ) 50 MCG/ACT nasal spray  Place 2 sprays into both nostrils daily. Patient not taking: Reported on 12/23/2022 12/28/19   Couture, Cortni S, PA-C  hydrocortisone  (ANUSOL -HC) 25 MG suppository Place 1 suppository (25 mg total) rectally 2 (two) times daily. Patient not taking: Reported on 07/01/2019 02/22/19   Dean Clarity, MD  loratadine  (CLARITIN ) 10 MG tablet Take 1 tablet (10 mg total) by mouth daily. Patient not taking: Reported on 12/23/2022 12/28/19 12/23/22  Couture, Cortni S, PA-C  medroxyPROGESTERone  (PROVERA ) 10 MG tablet Take 1 tablet (10 mg total) by mouth daily. For 10 days Patient not taking: Reported on 05/08/2019 03/14/19   Eveline Lynwood KANDICE, MD  methocarbamol  (ROBAXIN ) 500 MG tablet Take 1 tablet (500 mg total) by mouth every 8 (eight) hours as needed. Patient not taking: Reported on 07/01/2019 05/17/19   Petrucelli, Samantha R, PA-C  pantoprazole  (PROTONIX ) 40 MG tablet Take 1 tablet (40 mg total) by mouth daily. 12/23/22   Freddi Hamilton, MD    Allergies: Patient has no known allergies.    Review of Systems  Respiratory:  Positive for shortness of breath.   Cardiovascular:  Positive for chest pain.  All other systems reviewed and are negative.   Updated Vital Signs BP 130/80   Pulse 74   Temp 98.5 F (36.9 C)   Resp 17   Ht 5' 5 (1.651 m)   Wt 121.6 kg   LMP 03/20/2024   SpO2 100%   BMI 44.60 kg/m   Physical Exam Vitals and  nursing note reviewed.  Constitutional:      General: She is not in acute distress.    Appearance: She is well-developed.  HENT:     Head: Normocephalic and atraumatic.  Eyes:     Conjunctiva/sclera: Conjunctivae normal.  Cardiovascular:     Rate and Rhythm: Normal rate and regular rhythm.     Heart sounds: No murmur heard. Pulmonary:     Effort: Pulmonary effort is normal. No respiratory distress.     Breath sounds: Normal breath sounds. No decreased breath sounds, wheezing or rhonchi.  Abdominal:     Palpations: Abdomen is soft.     Tenderness: There is no  abdominal tenderness.  Musculoskeletal:        General: No swelling.     Cervical back: Neck supple.  Skin:    General: Skin is warm and dry.     Capillary Refill: Capillary refill takes less than 2 seconds.  Neurological:     Mental Status: She is alert.  Psychiatric:        Mood and Affect: Mood normal.     (all labs ordered are listed, but only abnormal results are displayed) Labs Reviewed  RESP PANEL BY RT-PCR (RSV, FLU A&B, COVID)  [id number]  CBC WITH DIFFERENTIAL/PLATELET  COMPREHENSIVE METABOLIC PANEL WITH GFR  D-DIMER, QUANTITATIVE  HCG, SERUM, QUALITATIVE  LIPID PANEL  TROPONIN T, HIGH SENSITIVITY  TROPONIN T, HIGH SENSITIVITY    EKG: EKG Interpretation Date/Time:  Sunday April 28 2024 17:17:05 EDT Ventricular Rate:  81 PR Interval:  169 QRS Duration:  91 QT Interval:  392 QTC Calculation: 455 R Axis:   56  Text Interpretation: Sinus rhythm Borderline T abnormalities, anterior leads Confirmed by Zackowski, Scott [phone]) on 04/28/2024 5:19:14 PM  Radiology: [facility] Chest 2 View Result Date: 04/28/2024 CLINICAL DATA:  Chest pain EXAM: CHEST - 2 VIEW COMPARISON:  01/29/2024 FINDINGS: The heart size and mediastinal contours are within normal limits. Both lungs are clear. The visualized skeletal structures are unremarkable. No pneumothorax. IMPRESSION: No active cardiopulmonary disease. Electronically Signed   By: Franky Crease M.D.   On: 04/28/2024 18:29     Procedures   Medications Ordered in the ED  ketorolac  (TORADOL ) 30 MG/ML injection 30 mg (30 mg Intravenous Given 04/28/24 2029)                                    Medical Decision Making Amount and/or Complexity of Data Reviewed Labs: ordered. Radiology: ordered.  Risk Prescription drug management.   This patient presents to the ED for concern of chest pain, shortness of breath, this involves an extensive number of treatment options, and is a complaint that carries with it a high risk of complications  and morbidity.  The differential diagnosis includes ACS, pneumonia, PE, arrhythmia   Co morbidities that complicate the patient evaluation  Amenorrhea   Lab Tests:  I Ordered, and personally interpreted labs.  The pertinent results include: CBC unremarkable, CMP unremarkable, hCG negative, respiratory panel negative, troponin T negative x 2, D-dimer negative at less than 0.42   Imaging Studies ordered:  I ordered imaging studies including chest x-ray I independently visualized and interpreted imaging which showed no acute cardiopulmonary process I agree with the radiologist interpretation   Cardiac Monitoring: / EKG:  The patient was maintained on a cardiac monitor.  I personally viewed and interpreted the cardiac monitored which showed an underlying rhythm of:  Sinus rhythm with borderline T wave abnormalities in the anterior leads   Consultations Obtained:  I requested consultation with none,  and discussed lab and imaging findings as well as pertinent plan - they recommend: N/A   Problem List / ED Course / Critical interventions / Medication management  Patient presents to the emergency department today with concerns of chest pain shortness of breath.  Past history significant for amenorrhea and seasonal allergies.  Reports that she had 3 days of burning type sensation towards epigastrium and the central portion of her chest.  States that her shortness of breath has progressively been worsening over this time.  Denies any dizziness, lightheadedness, nausea, vomiting, or diarrhea. Physical exam is unremarkable.  Normal heart or lung sounds.  No lower extremity swelling or edema.  No abdominal tenderness.  Normal bowel sounds. Workup is reassuring with no abnormal findings seen on CBC, CMP, troponin, D-dimer, respiratory panel, or hCG.  No clearly explainable cause of patient's chest pain shortness of breath that she was describing.  EKG is nonischemic with some borderline T wave  inversions that have been stable for the last year.  Lipid panel is currently pending and patient will follow-up with her PCP on this.  In the meantime, advised patient to plan on outpatient follow-up with cardiology if symptoms or not improving.  She otherwise stable for outpatient follow-up and discharged home in stable condition.  Strict return precautions discussed and patient verbalized understanding. I ordered medication including Toradol  for pain Reevaluation of the patient after these medicines showed that the patient improved I have reviewed the patients home medicines and have made adjustments as needed   Test / Admission - Considered:  Admission considered but patient stable for outpatient follow-up.  Final diagnoses:  Atypical chest pain    ED Discharge Orders          Ordered    naproxen  (NAPROSYN ) 500 MG tablet  2 times daily        04/28/24 2022    Ambulatory referral to Cardiology       Comments: If you have not heard from the Cardiology office within the next 72 hours please call [phone].   04/28/24 2022               Cecily Legrand LABOR, PA-C 04/28/24 2258    Geraldene Hamilton, MD 04/29/24 [phone]

## 2024-04-28 NOTE — Discharge Instructions (Signed)
 You were seen in the emergency department today for concerns of chest pain and shortness of breath.  Your labs and imaging were thankfully reassuring.  I am unsure exactly what is causing her current chest pain but does not appear to be any concern this time for obvious cardiac abnormalities or any pulmonary embolism.  I placed a referral to cardiology for you to seek secondary evaluation.  For any concerns of new or worsening symptoms, return to the emergency department.

## 2024-06-10 ENCOUNTER — Encounter: Payer: Self-pay | Admitting: *Deleted

## 2024-06-10 ENCOUNTER — Other Ambulatory Visit: Payer: Self-pay | Admitting: *Deleted

## 2024-06-10 ENCOUNTER — Ambulatory Visit: Attending: Cardiology | Admitting: Cardiology

## 2024-06-25 NOTE — Progress Notes (Signed)
 This is a real-time telehealth visit to improve healthcare delivery.  The visit was Audio Only Telehealth.     The patient provided verbal consent for the visit. The patient's name and date of birth were verified during the encounter. The benefits, limitations and confidentiality of telehealth visits were discussed. The patient acknowledged understanding that telemedicine does not provide emergency care and 911 should be called in all emergent situations. The location of the patient for this visit is at residence.  The time spent on the visit was 10 minutes  in duration.  Provider [place]   Interpreter used? No  SUBJECTIVE:  Annette Ferguson, Annette Ferguson  is a 28 year old who is here for Prescription Refill Request (Requests refill of triamcinolone  ointment for hyperpigmentation on her legs.)  Phone utilized for this visit.   Medications  Current Outpatient Medications:    triamcinolone  (KENALOG ) 0.1 % ointment, Apply topically 2 (two) times daily as needed for rash for up to 14 days., Disp: 60 g, Rfl: 1   albuterol  (PROVENTIL ) 2.5 mg /3 mL (0.083 %) nebulizer solution, Inhale 2.5 mg into the lungs every 6 (six) hours as needed, Disp: , Rfl:    azelastine (ASTELIN) 137 mcg (0.1 %) nasal spray, Place 1 Spray into the nostril(s) 2 (two) times daily (Patient not taking: Reported on 07/13/2023), Disp: 30 mL, Rfl: 1  Allergies Pollen   ROS  None per telephone visit.  OBJECTIVE: LMP 02/24/2022 (Approximate) (Periods are irregular)  Smoking Status Never  Exam Physical Exam  None per telephone visit.  ASSESSMENT/PLAN: 1. Medication refill (Primary) Other orders -     triamcinolone  (KENALOG ) 0.1 % ointment; Apply topically 2 (two) times daily as needed for rash for up to 14 days.e-Prescribing, topical, Disp-60 g, R-1  Dispense: 60 g; Refill: 1   Return if symptoms worsen or fail to improve.

## 2024-08-11 ENCOUNTER — Emergency Department (HOSPITAL_BASED_OUTPATIENT_CLINIC_OR_DEPARTMENT_OTHER)
Admission: EM | Admit: 2024-08-11 | Discharge: 2024-08-11 | Disposition: A | Discharge status: Home / Self Care | Attending: Emergency Medicine | Admitting: Emergency Medicine

## 2024-08-11 ENCOUNTER — Other Ambulatory Visit: Payer: Self-pay

## 2024-08-11 ENCOUNTER — Emergency Department (HOSPITAL_BASED_OUTPATIENT_CLINIC_OR_DEPARTMENT_OTHER)

## 2024-08-11 ENCOUNTER — Encounter (HOSPITAL_BASED_OUTPATIENT_CLINIC_OR_DEPARTMENT_OTHER): Payer: Self-pay | Admitting: Emergency Medicine

## 2024-08-11 DIAGNOSIS — J45909 Unspecified asthma, uncomplicated: Secondary | ICD-10-CM | POA: Diagnosis not present

## 2024-08-11 DIAGNOSIS — Z7951 Long term (current) use of inhaled steroids: Secondary | ICD-10-CM | POA: Diagnosis not present

## 2024-08-11 DIAGNOSIS — R1013 Epigastric pain: Secondary | ICD-10-CM | POA: Insufficient documentation

## 2024-08-11 DIAGNOSIS — R079 Chest pain, unspecified: Secondary | ICD-10-CM

## 2024-08-11 DIAGNOSIS — R0789 Other chest pain: Secondary | ICD-10-CM | POA: Diagnosis present

## 2024-08-11 HISTORY — DX: Unspecified asthma, uncomplicated: J45.909

## 2024-08-11 LAB — CBC WITH DIFFERENTIAL/PLATELET
Abs Immature Granulocytes: 0.01 K/uL (ref 0.00–0.07)
Basophils Absolute: 0 K/uL (ref 0.0–0.1)
Basophils Relative: 1 %
Eosinophils Absolute: 0.1 K/uL (ref 0.0–0.5)
Eosinophils Relative: 1 %
HCT: 38.7 % (ref 36.0–46.0)
Hemoglobin: 12.7 g/dL (ref 12.0–15.0)
Immature Granulocytes: 0 %
Lymphocytes Relative: 28 %
Lymphs Abs: 1.6 K/uL (ref 0.7–4.0)
MCH: 27.8 pg (ref 26.0–34.0)
MCHC: 32.8 g/dL (ref 30.0–36.0)
MCV: 84.7 fL (ref 80.0–100.0)
Monocytes Absolute: 0.5 K/uL (ref 0.1–1.0)
Monocytes Relative: 8 %
Neutro Abs: 3.4 K/uL (ref 1.7–7.7)
Neutrophils Relative %: 62 %
Platelets: 331 K/uL (ref 150–400)
RBC: 4.57 MIL/uL (ref 3.87–5.11)
RDW: 13.2 % (ref 11.5–15.5)
WBC: 5.5 K/uL (ref 4.0–10.5)
nRBC: 0 % (ref 0.0–0.2)

## 2024-08-11 LAB — COMPREHENSIVE METABOLIC PANEL WITH GFR
ALT: 30 U/L (ref 0–44)
AST: 21 U/L (ref 15–41)
Albumin: 4 g/dL (ref 3.5–5.0)
Alkaline Phosphatase: 55 U/L (ref 38–126)
Anion gap: 12 (ref 5–15)
BUN: 10 mg/dL (ref 6–20)
CO2: 25 mmol/L (ref 22–32)
Calcium: 9.1 mg/dL (ref 8.9–10.3)
Chloride: 100 mmol/L (ref 98–111)
Creatinine, Ser: 0.7 mg/dL (ref 0.44–1.00)
GFR, Estimated: >60 mL/min
Glucose, Bld: 85 mg/dL (ref 70–99)
Potassium: 3.8 mmol/L (ref 3.5–5.1)
Sodium: 137 mmol/L (ref 135–145)
Total Bilirubin: 0.4 mg/dL (ref 0.0–1.2)
Total Protein: 7.7 g/dL (ref 6.5–8.1)

## 2024-08-11 LAB — LIPASE, BLOOD: Lipase: 24 U/L (ref 11–51)

## 2024-08-11 LAB — D-DIMER, QUANTITATIVE: D-Dimer, Quant: 0.33 ug{FEU}/mL (ref 0.00–0.50)

## 2024-08-11 LAB — TROPONIN T, HIGH SENSITIVITY: Troponin T High Sensitivity: <15 ng/L (ref 0–19)

## 2024-08-11 LAB — HCG, SERUM, QUALITATIVE: Preg, Serum: NEGATIVE

## 2024-08-11 MED ORDER — LIDOCAINE VISCOUS HCL 2 % MT SOLN
15.0000 mL | Freq: Once | OROMUCOSAL | Status: AC
  Administered 2024-08-11: 15 mL via ORAL
  Filled 2024-08-11: qty 15

## 2024-08-11 MED ORDER — ALBUTEROL SULFATE HFA 108 (90 BASE) MCG/ACT IN AERS: 2.0000 | INHALATION_SPRAY | RESPIRATORY_TRACT | 0 refills | Status: AC | PRN

## 2024-08-11 MED ORDER — OMEPRAZOLE 20 MG PO CPDR: 20.0000 mg | DELAYED_RELEASE_CAPSULE | Freq: Every day | ORAL | 0 refills | Status: AC

## 2024-08-11 MED ORDER — IPRATROPIUM-ALBUTEROL 0.5-2.5 (3) MG/3ML IN SOLN
3.0000 mL | Freq: Once | RESPIRATORY_TRACT | Status: AC
  Administered 2024-08-11: 3 mL via RESPIRATORY_TRACT
  Filled 2024-08-11: qty 3

## 2024-08-11 MED ORDER — ALBUTEROL SULFATE (2.5 MG/3ML) 0.083% IN NEBU: 2.5000 mg | INHALATION_SOLUTION | Freq: Four times a day (QID) | RESPIRATORY_TRACT | 0 refills | Status: AC | PRN

## 2024-08-11 MED ORDER — ALUM & MAG HYDROXIDE-SIMETH 200-200-20 MG/5ML PO SUSP
30.0000 mL | Freq: Once | ORAL | Status: AC
  Administered 2024-08-11: 30 mL via ORAL
  Filled 2024-08-11: qty 30

## 2024-08-11 NOTE — ED Triage Notes (Signed)
 States for the past 3-4 days, she has had tightness in her entire chest, worse with cough, deep breath movement. Endorses, productive cough with green mucus. Her neb machine is not working and ran out of her inhaler

## 2024-08-11 NOTE — ED Provider Notes (Signed)
 " [facility] Provider Note   CSN: [id number] Arrival date & time: 08/11/24  1003     Patient presents with: Chest Pain   LYNORA DYMOND is a 29 y.o. female with a history of asthma who presents with 3-4 days of chest tightness, wax/wane epigastric pain, and shortness of breath.  Patient states that the chest pain is wax/wane, feels midsternal and occasionally epigastric in nature.  Patient also endorses some mild chest tightness due to congestion-she states that she has been out of her albuterol  at home and has been experiencing a productive cough.  Patient denies any flulike symptoms including fever, chills, myalgias, headache, nasal congestion, or sore throat.  The patient reports associated fatigue and decreased appetite, with intermittent nausea but denies persistent vomiting or diarrhea.  Symptoms began gradually and have remained despite supportive care at home including over-the-counter analgesia. The patient denies hemoptysis, syncope, confusion, neck stiffness, or focal neurological deficits.  The patient denies any recent travel, smoking, recent surgeries, or previous DVT/PE.  The patient is in no acute distress.    HPI     Prior to Admission medications  Medication Sig Start Date End Date Taking? Authorizing Provider  albuterol  (PROVENTIL ) (2.5 MG/3ML) 0.083% nebulizer solution Take 3 mLs (2.5 mg total) by nebulization every 6 (six) hours as needed for wheezing or shortness of breath. 08/11/24 08/21/24 Yes Ernesta Trabert L, PA  omeprazole  (PRILOSEC) 20 MG capsule Take 1 capsule (20 mg total) by mouth daily. 08/11/24 08/25/24 Yes Hardeep Reetz L, PA  albuterol  (VENTOLIN  HFA) 108 (90 Base) MCG/ACT inhaler Inhale 2 puffs into the lungs as needed for wheezing or shortness of breath. 08/11/24   Shaddai Shapley L, PA  azelastine (ASTELIN) 0.1 % nasal spray Place 1 spray into both nostrils as needed for rhinitis or allergies. 11/08/22   [provider]  cetirizine  (ZYRTEC ) 10 MG tablet Take 10 mg by mouth daily. 11/08/22   [provider]  drospirenone -ethinyl estradiol  (YAZ) 3-0.02 MG tablet Take 1 tablet by mouth daily. 11/29/23   Eveline Lynwood KANDICE, MD  famotidine  (PEPCID ) 20 MG tablet Take 1 tablet (20 mg total) by mouth 2 (two) times daily. 12/23/22   Freddi Hamilton, MD  fluticasone  (FLONASE ) 50 MCG/ACT nasal spray Place 2 sprays into both nostrils daily. Patient not taking: Reported on 12/23/2022 12/28/19   Couture, Cortni S, PA-C  hydrocortisone  (ANUSOL -HC) 25 MG suppository Place 1 suppository (25 mg total) rectally 2 (two) times daily. Patient not taking: Reported on 07/01/2019 02/22/19   Dean Clarity, MD  loratadine  (CLARITIN ) 10 MG tablet Take 1 tablet (10 mg total) by mouth daily. Patient not taking: Reported on 12/23/2022 12/28/19 12/23/22  Couture, Cortni S, PA-C  medroxyPROGESTERone  (PROVERA ) 10 MG tablet Take 1 tablet (10 mg total) by mouth daily. For 10 days Patient not taking: Reported on 05/08/2019 03/14/19   Eveline Lynwood KANDICE, MD  methocarbamol  (ROBAXIN ) 500 MG tablet Take 1 tablet (500 mg total) by mouth every 8 (eight) hours as needed. Patient not taking: Reported on 07/01/2019 05/17/19   Petrucelli, Samantha R, PA-C  naproxen  (NAPROSYN ) 500 MG tablet Take 1 tablet (500 mg total) by mouth 2 (two) times daily. 04/28/24   Zelaya, Oscar A, PA-C    Allergies: Grass pollen([id number]-o-r-t-swt vern), Other, and Dog epithelium (canis lupus familiaris)    Review of Systems  Respiratory:  Positive for chest tightness.     Updated Vital Signs BP 127/76 (BP Location: Right Arm)  Pulse 78   Temp 97.8 F (36.6 C) (Oral)   Resp 16   SpO2 100%   Physical Exam Vitals and nursing note reviewed.  Constitutional:      General: She is not in acute distress.    Appearance: Normal appearance.  HENT:     Head: Normocephalic and atraumatic.  Eyes:     Extraocular Movements: Extraocular movements intact.     Conjunctiva/sclera:  Conjunctivae normal.     Pupils: Pupils are equal, round, and reactive to light.  Cardiovascular:     Rate and Rhythm: Normal rate and regular rhythm.     Pulses: Normal pulses.  Pulmonary:     Effort: Pulmonary effort is normal. No respiratory distress.     Comments: Patient has no difficulty speaking in complete sentences. Abdominal:     General: Abdomen is flat.     Palpations: Abdomen is soft.     Tenderness: There is no abdominal tenderness.  Musculoskeletal:        General: Normal range of motion.     Cervical back: Normal range of motion.  Skin:    General: Skin is warm and dry.     Capillary Refill: Capillary refill takes less than 2 seconds.  Neurological:     General: No focal deficit present.     Mental Status: She is alert. Mental status is at baseline.  Psychiatric:        Mood and Affect: Mood normal.     (all labs ordered are listed, but only abnormal results are displayed) Labs Reviewed  CBC WITH DIFFERENTIAL/PLATELET  COMPREHENSIVE METABOLIC PANEL WITH GFR  LIPASE, BLOOD  D-DIMER, QUANTITATIVE  HCG, SERUM, QUALITATIVE  TROPONIN T, HIGH SENSITIVITY    EKG: EKG Interpretation Date/Time:  Sunday August 11 2024 10:13:10 EST Ventricular Rate:  73 PR Interval:  189 QRS Duration:  93 QT Interval:  436 QTC Calculation: 481 R Axis:   8  Text Interpretation: Sinus rhythm Nonspecific T abnormalities, diffuse leads Borderline prolonged QT interval Confirmed by Elnor Savant ([id number]) on 08/11/2024 12:15:16 PM  Radiology: No results found.   Procedures   Medications Ordered in the ED  ipratropium-albuterol  (DUONEB) 0.5-2.5 (3) MG/3ML nebulizer solution 3 mL (3 mLs Nebulization Given 08/11/24 1041)  alum & mag hydroxide-simeth (MAALOX/MYLANTA) 200-200-20 MG/5ML suspension 30 mL (30 mLs Oral Given 08/11/24 1213)    And  lidocaine  (XYLOCAINE ) 2 % viscous mouth solution 15 mL (15 mLs Oral Given 08/11/24 1213)                                 Medical Decision  Making Amount and/or Complexity of Data Reviewed Labs: ordered. Decision-making details documented in ED Course. Radiology: ordered. Decision-making details documented in ED Course. ECG/medicine tests:  Decision-making details documented in ED Course.  Risk OTC drugs. Prescription drug management.   Patient presents to the ED for: Chest pain, epigastric pain This involves an extensive number of treatment options Differential diagnosis includes: Gastritis Pancreatitis Chronic, asthma exacerbation Cardiac etiology Co-morbid conditions: Hormonal birth control use, asthma  Additional history/records obtained and reviewed: Additional history obtained from  outside medical records External records from outside source obtained and reviewed including prior PCP records.  Clinical Course as of 08/19/24 0556  [place] Aug 11, 2024  1019 Temp: 97.8 F (36.6 C) Afebrile, vital stable, patient in no acute distress [ML]  1020 EKG 12-Lead Sinus rhythm [ML]  1108 Patient given DuoNeb for  symptomatic relief-well-tolerated [ML]  1126 D-dimer, quantitative Negative [ML]  1126 CBC with Differential Unremarkable [ML]  1140 Lipase, blood WNL [ML]  1140 Troponin T, High Sensitivity Negative [ML]  1140 hCG, serum, qualitative Negative [ML]  1140 Comprehensive metabolic panel WNL [ML]  1140 DG Chest 2 View No acute findings [ML]    Clinical Course User Index [ML] Willma Duwaine CROME, PA    Data Reviewed / Actions Taken: Labs ordered/reviewed with my independent interpretation in ED course above. Imaging ordered/reviewed with my independent interpretation in ED course above. I agree with the radiologists interpretation.  EKG ordered/reviewed with my independent interpretation in ED course above.  Management / Treatments: Patient given DuoNeb, GI cocktail for symptomatic relief-well-tolerated  reevaluation of the patient after these medicines showed that the patient improved. I have reviewed the  patients home medicines and have made adjustments as needed  ED Course / Reassessments: Problem List: chest pain 29 year old female presented for chest pain. Initial assessment included history, physical exam, and review of prior medical records. Physical examination was reassuring, without evidence of respiratory distress, hemodynamic instability, or focal neurological deficits.  Diagnostic evaluation included ECG, which showed no acute ischemic changes, as well as cardiac enzymes, which were negative.  Additional testing included chest x-ray and laboratory studies which revealed no acute abnormalities.  Based on the patient's history, examination, and diagnostic workup, there is low clinical suspicion for acute coronary syndrome, pulmonary embolism, aortic dissection, pneumothorax or other emergent cardiopulmonary pathology at this time.  Symptoms were improved following supportive care, antacids, and bronchodilators. Patient was monitored in the emergency department with serial reassessments and cardiac monitoring. No clinical deterioration was observed.  Given reassuring workup, patient's discomfort likely related to ongoing cough/congestion with improvement with DuoNeb treatment.  However, patient also had relief with GI cocktail so could be gastritis/GERD related.  Patient is appropriate for discharge home at this time. Return precautions were discussed including worsening chest pain, shortness of breath, syncope, palpitations, or new symptoms.  Patient's at home albuterol  was refilled and prescription for omeprazole  was initiated given relief with GI cocktail.  Disposition: Disposition: Discharge with close follow-up with PCP for further evaluation and care Rationale for disposition: Stable for discharge The disposition plan and rationale were discussed with the patient at the bedside, all questions were addressed, and the patient demonstrated understanding.  This note was produced using [employer]. While I have reviewed and verified all clinical information, transcription errors may remain.      Final diagnoses:  Nonspecific chest pain  Epigastric pain    ED Discharge Orders          Ordered    albuterol  (VENTOLIN  HFA) 108 (90 Base) MCG/ACT inhaler  As needed        08/11/24 1253    albuterol  (PROVENTIL ) (2.5 MG/3ML) 0.083% nebulizer solution  Every 6 hours PRN        08/11/24 1253    omeprazole  (PRILOSEC) 20 MG capsule  Daily        08/11/24 1253               Willma Duwaine CROME, [place] 08/19/24 0558    Elnor Jayson LABOR, DO 08/19/24 0758  "

## 2024-08-11 NOTE — Discharge Instructions (Signed)
 Thank you for visiting the Emergency Department today. It was a pleasure to be part of your healthcare team.   Your were seen today for chest pain, and your workup was overall reassuring.  As discussed, rest, hydrate, resume diet as normal, utilize Tylenol  and ibuprofen  as needed for pain relief, resume asthma treatment regimen with albuterol , and utilize omeprazole  daily for gastric/chest discomfort  It is important to watch for warning signs such as worsening chest pain, trouble breathing, persistent nausea/vomiting/diarrhea, or any neurological symptoms. If any of these happen, return to the Emergency Department or call 911.  Thank you for trusting us  with your health.

## 2024-08-11 NOTE — ED Notes (Signed)
 ED Provider at bedside.
